# Patient Record
Sex: Female | Born: 1935 | Race: White | Hispanic: No | Smoking: Never smoker
Health system: Southern US, Community
[De-identification: ages and names within clinical notes are randomized; demographics above are authoritative.]

## PROBLEM LIST (undated history)

## (undated) DIAGNOSIS — I2510 Atherosclerotic heart disease of native coronary artery without angina pectoris: Secondary | ICD-10-CM

## (undated) DIAGNOSIS — K5090 Crohn's disease, unspecified, without complications: Secondary | ICD-10-CM

## (undated) DIAGNOSIS — J349 Unspecified disorder of nose and nasal sinuses: Secondary | ICD-10-CM

## (undated) DIAGNOSIS — I739 Peripheral vascular disease, unspecified: Secondary | ICD-10-CM

## (undated) DIAGNOSIS — I493 Ventricular premature depolarization: Secondary | ICD-10-CM

## (undated) DIAGNOSIS — K219 Gastro-esophageal reflux disease without esophagitis: Secondary | ICD-10-CM

## (undated) DIAGNOSIS — G629 Polyneuropathy, unspecified: Secondary | ICD-10-CM

## (undated) DIAGNOSIS — E164 Increased secretion of gastrin: Secondary | ICD-10-CM

## (undated) DIAGNOSIS — E785 Hyperlipidemia, unspecified: Secondary | ICD-10-CM

## (undated) DIAGNOSIS — J45909 Unspecified asthma, uncomplicated: Secondary | ICD-10-CM

## (undated) DIAGNOSIS — A498 Other bacterial infections of unspecified site: Secondary | ICD-10-CM

## (undated) DIAGNOSIS — E039 Hypothyroidism, unspecified: Secondary | ICD-10-CM

## (undated) DIAGNOSIS — K449 Diaphragmatic hernia without obstruction or gangrene: Secondary | ICD-10-CM

## (undated) DIAGNOSIS — R011 Cardiac murmur, unspecified: Secondary | ICD-10-CM

## (undated) DIAGNOSIS — N390 Urinary tract infection, site not specified: Secondary | ICD-10-CM

## (undated) DIAGNOSIS — E538 Deficiency of other specified B group vitamins: Secondary | ICD-10-CM

## (undated) DIAGNOSIS — K5730 Diverticulosis of large intestine without perforation or abscess without bleeding: Secondary | ICD-10-CM

## (undated) DIAGNOSIS — M4800 Spinal stenosis, site unspecified: Secondary | ICD-10-CM

## (undated) DIAGNOSIS — M129 Arthropathy, unspecified: Secondary | ICD-10-CM

## (undated) DIAGNOSIS — I10 Essential (primary) hypertension: Secondary | ICD-10-CM

## (undated) HISTORY — PX: POLYPECTOMY: SHX149

## (undated) HISTORY — DX: Ventricular premature depolarization: I49.3

## (undated) HISTORY — DX: Urinary tract infection, site not specified: N39.0

## (undated) HISTORY — PX: LEG SURGERY: SHX1003

## (undated) HISTORY — DX: Unspecified disorder of nose and nasal sinuses: J34.9

## (undated) HISTORY — DX: Cardiac murmur, unspecified: R01.1

## (undated) HISTORY — PX: COLONOSCOPY: SHX174

## (undated) HISTORY — DX: Diaphragmatic hernia without obstruction or gangrene: K44.9

## (undated) HISTORY — DX: Crohn's disease, unspecified, without complications: K50.90

## (undated) HISTORY — DX: Gastro-esophageal reflux disease without esophagitis: K21.9

## (undated) HISTORY — DX: Other bacterial infections of unspecified site: A49.8

## (undated) HISTORY — DX: Diverticulosis of large intestine without perforation or abscess without bleeding: K57.30

## (undated) HISTORY — PX: DILATION AND CURETTAGE OF UTERUS: SHX78

## (undated) HISTORY — DX: Hypothyroidism, unspecified: E03.9

## (undated) HISTORY — PX: CERVICAL POLYPECTOMY: SHX88

## (undated) HISTORY — DX: Arthropathy, unspecified: M12.9

## (undated) HISTORY — DX: Peripheral vascular disease, unspecified: I73.9

## (undated) HISTORY — DX: Hyperlipidemia, unspecified: E78.5

## (undated) HISTORY — DX: Essential (primary) hypertension: I10

## (undated) HISTORY — DX: Atherosclerotic heart disease of native coronary artery without angina pectoris: I25.10

## (undated) HISTORY — PX: CATARACT EXTRACTION: SUR2

## (undated) HISTORY — DX: Unspecified asthma, uncomplicated: J45.909

## (undated) HISTORY — DX: Spinal stenosis, site unspecified: M48.00

## (undated) HISTORY — DX: Deficiency of other specified B group vitamins: E53.8

## (undated) HISTORY — DX: Increased secretion of gastrin: E16.4

---
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
## 1974-03-17 HISTORY — PX: STAPEDECTOMY: SHX2435

## 1990-03-17 HISTORY — PX: FEMORAL ARTERY - POPLITEAL ARTERY BYPASS GRAFT: SUR180

## 1992-03-17 HISTORY — PX: KNEE ARTHROSCOPY: SUR90

## 1997-02-14 HISTORY — PX: ANGIOPLASTY: SHX39

## 2000-04-21 ENCOUNTER — Other Ambulatory Visit: Admission: RE | Admit: 2000-04-21 | Discharge: 2000-04-21 | Payer: Self-pay | Admitting: Obstetrics and Gynecology

## 2000-11-20 ENCOUNTER — Encounter: Admission: RE | Admit: 2000-11-20 | Discharge: 2000-11-20 | Payer: Self-pay | Admitting: Orthopedic Surgery

## 2000-11-20 ENCOUNTER — Encounter: Payer: Self-pay | Admitting: Orthopedic Surgery

## 2000-11-23 ENCOUNTER — Ambulatory Visit (HOSPITAL_BASED_OUTPATIENT_CLINIC_OR_DEPARTMENT_OTHER): Admission: RE | Admit: 2000-11-23 | Discharge: 2000-11-23 | Payer: Self-pay | Admitting: Orthopedic Surgery

## 2001-11-17 ENCOUNTER — Encounter: Payer: Self-pay | Admitting: Gastroenterology

## 2004-07-17 ENCOUNTER — Ambulatory Visit: Payer: Self-pay | Admitting: Cardiology

## 2004-08-13 ENCOUNTER — Ambulatory Visit: Payer: Self-pay | Admitting: Gastroenterology

## 2005-08-25 ENCOUNTER — Encounter (HOSPITAL_BASED_OUTPATIENT_CLINIC_OR_DEPARTMENT_OTHER): Admission: RE | Admit: 2005-08-25 | Discharge: 2005-10-22 | Payer: Self-pay | Admitting: Surgery

## 2006-05-26 ENCOUNTER — Ambulatory Visit: Payer: Self-pay | Admitting: Gastroenterology

## 2006-05-26 LAB — CONVERTED CEMR LAB
Albumin: 3.5 g/dL (ref 3.5–5.2)
Basophils Absolute: 0.1 10*3/uL (ref 0.0–0.1)
Bilirubin, Direct: 0.1 mg/dL (ref 0.0–0.3)
Ferritin: 6.8 ng/mL — ABNORMAL LOW (ref 10.0–291.0)
Folate: >20 ng/mL
GFR calc Af Amer: 102 mL/min
GFR calc non Af Amer: 84 mL/min
Glucose, Bld: 122 mg/dL — ABNORMAL HIGH (ref 70–99)
Hemoglobin: 12.6 g/dL (ref 12.0–15.0)
Iron: 21 ug/dL — ABNORMAL LOW (ref 42–145)
Lymphocytes Relative: 27.4 % (ref 12.0–46.0)
MCHC: 32.6 g/dL (ref 30.0–36.0)
Monocytes Absolute: 0.8 10*3/uL — ABNORMAL HIGH (ref 0.2–0.7)
Monocytes Relative: 8.4 % (ref 3.0–11.0)
Neutro Abs: 5.6 10*3/uL (ref 1.4–7.7)
Platelets: 317 10*3/uL (ref 150–400)
Potassium: 3.7 meq/L (ref 3.5–5.1)
RDW: 16.8 % — ABNORMAL HIGH (ref 11.5–14.6)
Sed Rate: 9 mm/hr (ref 0–25)
Sodium: 140 meq/L (ref 135–145)
TSH: 3.63 microintl units/mL (ref 0.35–5.50)
Vitamin B-12: 339 pg/mL (ref 211–911)

## 2006-06-03 ENCOUNTER — Encounter (INDEPENDENT_AMBULATORY_CARE_PROVIDER_SITE_OTHER): Payer: Self-pay | Admitting: *Deleted

## 2006-06-03 ENCOUNTER — Ambulatory Visit: Payer: Self-pay | Admitting: Gastroenterology

## 2006-08-17 ENCOUNTER — Ambulatory Visit: Payer: Self-pay | Admitting: Gastroenterology

## 2006-08-17 LAB — CONVERTED CEMR LAB
Basophils Relative: 0.9 % (ref 0.0–1.0)
Iron: 29 ug/dL — ABNORMAL LOW (ref 42–145)
Lymphocytes Relative: 31.5 % (ref 12.0–46.0)
Monocytes Relative: 10.2 % (ref 3.0–11.0)
Neutro Abs: 4.3 10*3/uL (ref 1.4–7.7)
Platelets: 303 10*3/uL (ref 150–400)
RBC: 5.52 M/uL — ABNORMAL HIGH (ref 3.87–5.11)
RDW: 16.8 % — ABNORMAL HIGH (ref 11.5–14.6)
Saturation Ratios: 6.3 % — ABNORMAL LOW (ref 20.0–50.0)
Transferrin: 330 mg/dL (ref >=212.0)

## 2006-10-06 ENCOUNTER — Ambulatory Visit: Payer: Self-pay | Admitting: Gastroenterology

## 2006-10-06 ENCOUNTER — Ambulatory Visit: Payer: Self-pay | Admitting: Vascular Surgery

## 2006-10-06 LAB — CONVERTED CEMR LAB
Basophils Absolute: 0.1 10*3/uL (ref 0.0–0.1)
Eosinophils Absolute: 0 10*3/uL (ref 0.0–0.6)
Eosinophils Relative: 0.1 % (ref 0.0–5.0)
MCV: 67 fL — ABNORMAL LOW (ref 78.0–100.0)
Platelets: 358 10*3/uL (ref 150–400)
RBC: 5.45 M/uL — ABNORMAL HIGH (ref 3.87–5.11)
Saturation Ratios: 5.1 % — ABNORMAL LOW (ref 20.0–50.0)
Transferrin: 281 mg/dL (ref >=212.0)
WBC: 7.7 10*3/uL (ref 4.5–10.5)

## 2007-03-18 HISTORY — PX: CARPAL TUNNEL RELEASE: SHX101

## 2007-06-30 DIAGNOSIS — N390 Urinary tract infection, site not specified: Secondary | ICD-10-CM

## 2007-06-30 DIAGNOSIS — I739 Peripheral vascular disease, unspecified: Secondary | ICD-10-CM

## 2007-06-30 DIAGNOSIS — E039 Hypothyroidism, unspecified: Secondary | ICD-10-CM | POA: Insufficient documentation

## 2007-06-30 DIAGNOSIS — E785 Hyperlipidemia, unspecified: Secondary | ICD-10-CM

## 2007-06-30 DIAGNOSIS — K5090 Crohn's disease, unspecified, without complications: Secondary | ICD-10-CM | POA: Insufficient documentation

## 2007-06-30 DIAGNOSIS — K449 Diaphragmatic hernia without obstruction or gangrene: Secondary | ICD-10-CM | POA: Insufficient documentation

## 2007-06-30 DIAGNOSIS — J45909 Unspecified asthma, uncomplicated: Secondary | ICD-10-CM | POA: Insufficient documentation

## 2007-06-30 DIAGNOSIS — M4800 Spinal stenosis, site unspecified: Secondary | ICD-10-CM

## 2007-06-30 DIAGNOSIS — M129 Arthropathy, unspecified: Secondary | ICD-10-CM | POA: Insufficient documentation

## 2007-06-30 DIAGNOSIS — K5730 Diverticulosis of large intestine without perforation or abscess without bleeding: Secondary | ICD-10-CM | POA: Insufficient documentation

## 2007-06-30 DIAGNOSIS — K219 Gastro-esophageal reflux disease without esophagitis: Secondary | ICD-10-CM

## 2007-06-30 DIAGNOSIS — I2510 Atherosclerotic heart disease of native coronary artery without angina pectoris: Secondary | ICD-10-CM | POA: Insufficient documentation

## 2007-09-21 ENCOUNTER — Ambulatory Visit: Payer: Self-pay | Admitting: Vascular Surgery

## 2007-09-30 ENCOUNTER — Encounter: Payer: Self-pay | Admitting: Gastroenterology

## 2007-10-28 ENCOUNTER — Ambulatory Visit: Payer: Self-pay | Admitting: Gastroenterology

## 2007-11-11 ENCOUNTER — Ambulatory Visit: Payer: Self-pay | Admitting: Cardiology

## 2008-04-16 ENCOUNTER — Inpatient Hospital Stay (HOSPITAL_COMMUNITY): Admission: EM | Admit: 2008-04-16 | Discharge: 2008-04-19 | Payer: Self-pay | Admitting: Emergency Medicine

## 2008-04-17 ENCOUNTER — Ambulatory Visit: Payer: Self-pay | Admitting: Surgery

## 2008-05-01 ENCOUNTER — Ambulatory Visit: Payer: Self-pay | Admitting: Surgery

## 2008-05-15 ENCOUNTER — Ambulatory Visit: Payer: Self-pay | Admitting: Surgery

## 2008-07-17 ENCOUNTER — Ambulatory Visit: Payer: Self-pay | Admitting: Vascular Surgery

## 2008-07-18 ENCOUNTER — Ambulatory Visit: Payer: Self-pay | Admitting: Vascular Surgery

## 2008-07-19 ENCOUNTER — Encounter: Payer: Self-pay | Admitting: Vascular Surgery

## 2008-07-19 ENCOUNTER — Inpatient Hospital Stay (HOSPITAL_COMMUNITY): Admission: EM | Admit: 2008-07-19 | Discharge: 2008-07-21 | Payer: Self-pay | Admitting: Internal Medicine

## 2008-08-02 ENCOUNTER — Ambulatory Visit: Payer: Self-pay | Admitting: Vascular Surgery

## 2008-08-16 ENCOUNTER — Ambulatory Visit: Payer: Self-pay | Admitting: Vascular Surgery

## 2008-11-13 ENCOUNTER — Encounter: Payer: Self-pay | Admitting: Cardiology

## 2008-11-13 DIAGNOSIS — I10 Essential (primary) hypertension: Secondary | ICD-10-CM | POA: Insufficient documentation

## 2008-11-13 DIAGNOSIS — R011 Cardiac murmur, unspecified: Secondary | ICD-10-CM | POA: Insufficient documentation

## 2008-11-13 DIAGNOSIS — I4510 Unspecified right bundle-branch block: Secondary | ICD-10-CM | POA: Insufficient documentation

## 2008-11-13 DIAGNOSIS — I4949 Other premature depolarization: Secondary | ICD-10-CM | POA: Insufficient documentation

## 2008-11-14 ENCOUNTER — Ambulatory Visit: Payer: Self-pay | Admitting: Cardiology

## 2008-11-22 ENCOUNTER — Ambulatory Visit: Payer: Self-pay | Admitting: Vascular Surgery

## 2008-12-07 ENCOUNTER — Ambulatory Visit: Payer: Self-pay | Admitting: Gastroenterology

## 2009-04-24 ENCOUNTER — Telehealth: Payer: Self-pay | Admitting: Gastroenterology

## 2009-09-04 ENCOUNTER — Encounter (INDEPENDENT_AMBULATORY_CARE_PROVIDER_SITE_OTHER): Payer: Self-pay | Admitting: *Deleted

## 2010-04-16 NOTE — Progress Notes (Signed)
Summary: Insurance will not pay for Colazal  Medications Added ASACOL 400 MG TBEC (MESALAMINE) 2 tabs two times a day       Phone Note Call from Patient Call back at Home Phone [phone]   Caller: Patient Call For: Dr. [name] Reason for Call: Talk to Nurse Summary of Call: Pt.'s insurance will not pay for the Colazal. Wants to know if something else can be prescribed? Initial call taken by: Karna Christmas,  April 24, 2009 3:09 PM  Follow-up for Phone Call        asacol 400 mg..2..//two times a day is ok   Follow-up by: Mardella Layman MD [facility],  April 24, 2009 4:10 PM  Additional Follow-up for Phone Call Additional follow up Details #1::        Pt notified.  Rx sent to pharmacy. Additional Follow-up by: Ashok Cordia RN,  April 24, 2009 4:31 PM    New/Updated Medications: ASACOL 400 MG TBEC (MESALAMINE) 2 tabs two times a day Prescriptions: ASACOL 400 MG TBEC (MESALAMINE) 2 tabs two times a day  #120 x 3   Entered by:   Ashok Cordia RN   Authorized by:   Mardella Layman MD [facility]   Signed by:   Ashok Cordia RN on 04/24/2009   Method used:   Electronically to        [facility] [phone]* (retail)       [address]       [place], [place]  [address]       Ph: [phone]       Fax: [phone]   RxID:   [id number]

## 2010-04-16 NOTE — Letter (Signed)
Summary: Appointment - Reminder 2  [facility], Main Office  [address]   [place], [place] [address]   Phone: [phone]  Fax: [phone]     September 04, 2009 MRN: [id number]   [facility] [address] [place], [place]  [address]   Dear Ms. MCBEAN,  Our records indicate that it is time to schedule a follow-up appointment with Dr. Myrtis Ser in August. It is very important that we reach you to schedule this appointment. We look forward to participating in your health care needs. Please contact us at the number listed above at your earliest convenience to schedule your appointment.  If you are unable to make an appointment at this time, give [place] a call so we can update our records.     Sincerely,   Migdalia Dk [facility] Scheduling Team

## 2010-06-25 LAB — DIFFERENTIAL
Basophils Absolute: 0 10*3/uL (ref 0.0–0.1)
Basophils Absolute: 0 10*3/uL (ref 0.0–0.1)
Basophils Relative: 0 % (ref 0–1)
Eosinophils Absolute: 0 10*3/uL (ref 0.0–0.7)
Eosinophils Relative: 0 % (ref 0–5)
Lymphocytes Relative: 14 % (ref 12–46)
Monocytes Absolute: 0.6 10*3/uL (ref 0.1–1.0)
Monocytes Absolute: 0.9 10*3/uL (ref 0.1–1.0)
Neutro Abs: 8.4 10*3/uL — ABNORMAL HIGH (ref 1.7–7.7)
Neutrophils Relative %: 78 % — ABNORMAL HIGH (ref 43–77)

## 2010-06-25 LAB — BASIC METABOLIC PANEL
CO2: 25 mEq/L (ref 19–32)
Calcium: 7.9 mg/dL — ABNORMAL LOW (ref 8.4–10.5)
Creatinine, Ser: 0.63 mg/dL (ref 0.4–1.2)
GFR calc Af Amer: >60 mL/min (ref >60)
GFR calc non Af Amer: >60 mL/min (ref >60)
Glucose, Bld: 154 mg/dL — ABNORMAL HIGH (ref 70–99)
Sodium: 134 mEq/L — ABNORMAL LOW (ref 135–145)

## 2010-06-25 LAB — TYPE AND SCREEN

## 2010-06-25 LAB — POCT I-STAT 4, (NA,K, GLUC, HGB,HCT)
Glucose, Bld: 117 mg/dL — ABNORMAL HIGH (ref 70–99)
HCT: 25 % — ABNORMAL LOW (ref 36.0–46.0)
Potassium: 3.7 mEq/L (ref 3.5–5.1)

## 2010-06-25 LAB — CBC
Hemoglobin: 10.1 g/dL — ABNORMAL LOW (ref 12.0–15.0)
MCHC: 33.1 g/dL (ref 30.0–36.0)
MCHC: 33.9 g/dL (ref 30.0–36.0)
MCV: 75.9 fL — ABNORMAL LOW (ref 78.0–100.0)
Platelets: 274 10*3/uL (ref 150–400)
RDW: 17.1 % — ABNORMAL HIGH (ref 11.5–15.5)
WBC: 8.7 10*3/uL (ref 4.0–10.5)

## 2010-06-25 LAB — PROTIME-INR: Prothrombin Time: 13.2 seconds (ref 11.6–15.2)

## 2010-07-01 LAB — BASIC METABOLIC PANEL
CO2: 26 mEq/L (ref 19–32)
Chloride: 104 mEq/L (ref 96–112)
Creatinine, Ser: 0.53 mg/dL (ref 0.4–1.2)
GFR calc Af Amer: >60 mL/min (ref >60)
Sodium: 140 mEq/L (ref 135–145)

## 2010-07-01 LAB — CBC
HCT: 46.3 % — ABNORMAL HIGH (ref 36.0–46.0)
Hemoglobin: 14.8 g/dL (ref 12.0–15.0)
RBC: 6.2 MIL/uL — ABNORMAL HIGH (ref 3.87–5.11)
RDW: 16 % — ABNORMAL HIGH (ref 11.5–15.5)
WBC: 9.9 10*3/uL (ref 4.0–10.5)

## 2010-07-01 LAB — DIFFERENTIAL
Basophils Absolute: 0 10*3/uL (ref 0.0–0.1)
Eosinophils Relative: 0 % (ref 0–5)
Lymphocytes Relative: 29 % (ref 12–46)
Lymphs Abs: 2.9 10*3/uL (ref 0.7–4.0)
Monocytes Absolute: 0.6 10*3/uL (ref 0.1–1.0)
Monocytes Relative: 6 % (ref 3–12)
Neutro Abs: 6.4 10*3/uL (ref 1.7–7.7)

## 2010-07-01 LAB — POCT I-STAT 4, (NA,K, GLUC, HGB,HCT)
Glucose, Bld: 175 mg/dL — ABNORMAL HIGH (ref 70–99)
Hemoglobin: 11.6 g/dL — ABNORMAL LOW (ref 12.0–15.0)
Potassium: 4.5 mEq/L (ref 3.5–5.1)
Sodium: 138 mEq/L (ref 135–145)

## 2010-07-01 LAB — TYPE AND SCREEN
ABO/RH(D): A NEG
Antibody Screen: NEGATIVE

## 2010-07-01 LAB — ABO/RH: ABO/RH(D): A NEG

## 2010-07-01 LAB — APTT: aPTT: 28 seconds (ref 24–37)

## 2010-07-02 LAB — CBC
Hemoglobin: 11.8 g/dL — ABNORMAL LOW (ref 12.0–15.0)
MCHC: 32.4 g/dL (ref 30.0–36.0)
MCHC: 33.7 g/dL (ref 30.0–36.0)
MCHC: 33.8 g/dL (ref 30.0–36.0)
MCV: 79.6 fL (ref 78.0–100.0)
MCV: 80.7 fL (ref 78.0–100.0)
Platelets: 205 10*3/uL (ref 150–400)
Platelets: 212 10*3/uL (ref 150–400)
RBC: 4.07 MIL/uL (ref 3.87–5.11)
RDW: 17.7 % — ABNORMAL HIGH (ref 11.5–15.5)

## 2010-07-02 LAB — BASIC METABOLIC PANEL
BUN: 9 mg/dL (ref 6–23)
BUN: 9 mg/dL (ref 6–23)
CO2: 22 mEq/L (ref 19–32)
Calcium: 7.4 mg/dL — ABNORMAL LOW (ref 8.4–10.5)
Chloride: 98 mEq/L (ref 96–112)
Creatinine, Ser: 0.49 mg/dL (ref 0.4–1.2)
GFR calc non Af Amer: >60 mL/min (ref >60)
Glucose, Bld: 118 mg/dL — ABNORMAL HIGH (ref 70–99)
Glucose, Bld: 192 mg/dL — ABNORMAL HIGH (ref 70–99)
Potassium: 3.7 mEq/L (ref 3.5–5.1)

## 2010-07-02 LAB — POCT I-STAT 7, (LYTES, BLD GAS, ICA,H+H)
Calcium, Ion: 1.13 mmol/L (ref 1.12–1.32)
HCT: 27 % — ABNORMAL LOW (ref 36.0–46.0)
Hemoglobin: 9.2 g/dL — ABNORMAL LOW (ref 12.0–15.0)
Potassium: 4.3 mEq/L (ref 3.5–5.1)
Sodium: 139 mEq/L (ref 135–145)
pCO2 arterial: 33.4 mmHg — ABNORMAL LOW (ref 35.0–45.0)
pH, Arterial: 7.407 — ABNORMAL HIGH (ref 7.350–7.400)

## 2010-07-02 LAB — PROTIME-INR
INR: 1.3 (ref 0.00–1.49)
Prothrombin Time: 15.4 seconds — ABNORMAL HIGH (ref 11.6–15.2)

## 2010-07-30 NOTE — Procedures (Signed)
BYPASS GRAFT EVALUATION   INDICATION:  Left lower extremity bypass graft.   HISTORY:  Diabetes:  No.  Cardiac:  No.  Hypertension:  Yes.  Smoking:  No.  Previous Surgery:  Thromboembolectomy of the left fem-pop bypass graft,  popliteal and anterior tibial arteries on 04/17/08.   SINGLE LEVEL ARTERIAL EXAM                               RIGHT              LEFT  Brachial:                    139                135  Anterior tibial:             Noncompressible    147  Posterior tibial:            148                120  Peroneal:  Ankle/brachial index:        Noncompressible    1.06   PREVIOUS ABI:  Date: 05/01/08  RIGHT:  Noncompressible  LEFT:  1.1   LOWER EXTREMITY BYPASS GRAFT DUPLEX EXAM:   DUPLEX:  Biphasic Doppler waveforms noted throughout the left lower  extremity bypass graft, and the popliteal and anterior tibial arteries  with a mildly increased velocity of 183 cm/s noted in the left proximal  anterior tibial artery.   IMPRESSION:  1. Patent left femoropopliteal bypass graft and thrombectomy sites      with an increased velocity noted, as described above.  2. Stable bilateral ABI readings with the bilateral toe brachial      indices remaining within normal limits. See attached worksheet for      values.       ___________________________________________  V. Charlena Cross, MD   CH/MEDQ  D:  07/17/2008  T:  07/17/2008  Job:  [id number]

## 2010-07-30 NOTE — Op Note (Signed)
[name], [name]               ACCOUNT NO.:  [id number]   MEDICAL RECORD NO.:  [id number]          PATIENT TYPE:  INP   LOCATION:  2550                         FACILITY:  [facility]   PHYSICIAN:  Juleen China IV, MDDATE OF BIRTH:  1915-10-20   DATE OF PROCEDURE:  04/17/2008  DATE OF DISCHARGE:                               OPERATIVE REPORT   PREOPERATIVE DIAGNOSIS:  Ischemic left leg.   POSTOPERATIVE DIAGNOSIS:  Ischemic left leg.   PROCEDURES PERFORMED:  1. Thromboembolectomy of left femoral below-knee popliteal artery      bypass graft.  2. Thromboembolectomy of left popliteal artery.  3. Thromboembolectomy of left anterior tibial artery.  4. Intraoperative angiogram.  5. Patch angioplasty of left popliteal artery with bovine pericardial      patch.   TYPE OF ANESTHESIA:  General.   BLOOD LOSS:  500 mL.   COMPLICATIONS:  None.   DRAINS:  15 Blake.   INDICATIONS:  This is a 77 year old female who presented to the  emergency department after her daughter found her left leg to be cold in  temperature.  The patient has had longstanding neuropathy.  She has not  walked for years.  At baseline, she does not have sensation or movement  in her left leg.  She has an ultrasound from approximately 1 year ago  that showed her bypass graft, which was performed in 1992 was still  patent.  I elected to perform an arteriogram prior to operation to  define her anatomy.  This revealed occlusion of her bypass graft with a  diminutive posterior tibial artery being her only runoff.  She had  minimal contrast visualized across the ankle.  These findings, I felt  that our only attempt at limb salvage would be operative intervention.  I discussed this extensively with the family.  They understand that she  is at high risk because of her age.  She is also at high risk for limb  loss.   PROCEDURE:  The patient identified in the holding area and taken to room-  6.  She was placed supine on  the table.  General endotracheal anesthesia  was administered.  The patient was prepped and draped in standard  sterile fashion.  I began in the below-knee incision.  A 10 blade was  used to open the skin.  Cautery was used to dissect through the  subcutaneous tissue.  The fascia was opened with cautery.  The popliteal  space was entered.  The soleus muscle was taken down off the posterior  edge of the tibia.  I identified the bypass graft and immobilized this  for approximately 2 cm and encircled it with a vessel loop.  The  popliteal artery was then identified and skeletonized.  The anterior  tibial artery and tibioperoneal trunk were also skeletonized.  At this  point, the patient was given systemic heparinization.  A transverse  arteriotomy was made in the hood of the graft.  I first passed a #5  Fogarty proximally, significant fresh thrombus was evacuated.  After  multiple passes of the embolectomy catheter, a fair  amount of neointima  was removed.  Once this was done, excellent inflow was established.  The  graft was occluded with a vascular clamp and then I passed #3 Fogarty  distally.  This went down to the ankle.  Thrombus was evacuated and  backbleeding was established.  At this point, an angiogram was performed  through an 8-French sheath in the graftotomy.  This revealed her  dominant runoff vessel to be her anterior tibial artery, which did cross  the ankle.  The angiogram did not evaluate a popliteal artery, but based  on her ultrasound over a year ago it suggested that there was  significant native popliteal artery disease below the bypass graft.  I  felt that this was most likely the region the bypass graft was occluded  and therefore I elected to open the popliteal artery.  This was opened  down to the anterior tibial artery.  The artery was heavily diseased.  I  elected to perform patch angioplasty of the popliteal artery beginning  at the bypass graft down to the takeoff  of the anterior tibial artery.  I did remove some of the intima in the distal popliteal artery at the  level down to the anterior tibial artery.  One 7-0 tacking suture was  placed in the anterior tibial artery.  Bovine pericardial patch was  selected, it was cut to the appropriate size and a running anastomosis  was performed with a 6-0 Prolene.  Once the patch angioplasty was  performed, the distal clamps were released and there was good  backbleeding.  I then performed an additional thrombectomy of the bypass  graft and reestablished excellent inflow.  The graftotomy was closed  with a running CV-5 Gore suture.  The clamp on the graft was then  released.  I had a palpable postop dorsalis pedis pulse at the ankle.  With this finding I elected to give her 15 mg of protamine to reverse  her heparin.  The wound was then irrigated.  Hemostasis was achieved and  brought out a 15 Blake drain through a separate incision.  It was sewn  into place with a 3-0 nylon.  The fascia was not closed, but the deep  tissue was reapproximated with 3-0 Vicryl.  The skin was closed with 4-0  Vicryl and Dermabond was placed.  The patient was then successfully  extubated.           ______________________________  V. Charlena Cross, MD  Electronically Signed     VWB/MEDQ  D:  04/17/2008  T:  04/17/2008  Job:  [id number]

## 2010-07-30 NOTE — Procedures (Signed)
BYPASS GRAFT EVALUATION   INDICATION:  Follow-up evaluation of left leg bypass graft.  Patient  complains of no symptoms at this time.   HISTORY:  Diabetes:  No.  Cardiac:  No.  Hypertension:  Yes.  Smoking:  No.  Previous Surgery:  Left fem-pop bypass graft with Gore-Tex on 12/27/90.   SINGLE LEVEL ARTERIAL EXAM                               RIGHT              LEFT  Brachial:                    141                155  Anterior tibial:             186                136  Posterior tibial:            76                 137  Peroneal:  Ankle/brachial index:        >1.0               0.88   PREVIOUS ABI:  Date: 07/31/07  RIGHT:  >1.0  LEFT:  >1.0   LOWER EXTREMITY BYPASS GRAFT DUPLEX EXAM:   DUPLEX:  Doppler arterial waveforms are biphasic proximal to, within,  and distal to the left fem-pop bypass graft with an elevated peak  systolic velocity in the distal native popliteal artery of 244 cm/s.   IMPRESSION:  1. Patent left femoropopliteal bypass graft.  2. Greater than 50% distal native popliteal artery stenosis.  3. Left ankle brachial index is slightly lower than previously      recorded.  4. Monophasic Doppler signal in the right leg with a >1.0 ankle      brachial index, suggesting medial calcification.   Preliminary report was given to Dr. Hart Rochester.   ___________________________________________  Quita Skye. Hart Rochester, M.D.   MC/MEDQ  D:  09/21/2007  T:  09/21/2007  Job:  [id number]

## 2010-07-30 NOTE — Assessment & Plan Note (Signed)
OFFICE VISIT   [name], [name]  DOB:  01/22/1916                                       08/16/2008  EAVWU#:[id number]   The patient is a 77 year old female who previously has had a left fem-  pop bypass and multiple thrombectomies of this.  The bypass has been  abandoned at this point.  She returns today for removal of staples as  well as reevaluation of her left foot.   She states that occasionally she does have some pain in the left foot.  She takes a half of a Percocet tablet two to three times per day.  The  pain seems to be fairly mild in character.   The patient's daughter also asked today about a possible angioplasty or  stenting procedure for the left leg.  I advised them that I did not  think she would be a candidate for this as this was going to be a long  segment occlusion in her left leg that would not be revascularizable  with angioplasty techniques.  I also advised them that I would be happy  to refer them for a second opinion if they wished to pursue this  further.  They did not seem to be interested in this at that time.   PHYSICAL EXAM:  Today blood pressure is 145/67 in the left arm, pulse is  80 and regular.  Left lower extremity she has a well-healed medial leg  incision just below the knee.  Staples were removed from this today.  Left foot is pink, warm and well-perfused.  There are no ulcerations on  the feet.  The foot is nontender to palpation.  There is no edema.   Overall I believe the patient is doing fairly well.  Her ABI on May 19  was 0.4.  However, she seems to be relatively asymptomatic.  I believe  the best option for her is continued conservative management.  We will  see her again in three months' time and repeat her ABIs at that time.  She will return sooner if she develops unrelenting rest pain, evidence  of tissue breakdown or evidence of infection in the left foot.   Janetta Hora. Fields, MD  Electronically  Signed   CEF/MEDQ  D:  08/16/2008  T:  08/17/2008  Job:  [id number]   cc:   Dr Carleene Overlie

## 2010-07-30 NOTE — Assessment & Plan Note (Signed)
OFFICE VISIT   [name], [name]  DOB:  1915-11-21                                       05/15/2008  WUJWJ#:[id number]   REASON FOR VISIT:  Followup.   HISTORY:  This is a 77 year old female who presented with an ischemic  left leg February 1.  She underwent a left femoral popliteal bypass  graft in 1992 which was confirmed to be patent by ultrasound last year.  It has occluded which led to ischemic leg.  I had attempted to perform a  thrombectomy which was unsuccessful and therefore did a jump graft off  her old bypass graft through her distal popliteal artery.  She tolerated  this procedure well and was discharged from the hospital.  When I saw  her last she had some edema in her left leg as well as some erythema  around her incision.  I placed her on Levaquin and recommended  compression therapy.  She comes back in today for followup.  Other than  some interval laryngitis she has been doing well at home.  Her  cellulitis has resolved.  Her swelling has improved with her  compression.   DICTATION ENDS AT THIS POINT   V. Charlena Cross, MD  Electronically Signed   VWB/MEDQ  D:  05/15/2008  T:  05/17/2008  Job:  [id number]

## 2010-07-30 NOTE — Discharge Summary (Signed)
NAMECAMBRIA, OSTEN               ACCOUNT NO.:  [id number]   MEDICAL RECORD NO.:  [id number]          PATIENT TYPE:  INP   LOCATION:  2015                         FACILITY:  [facility]   PHYSICIAN:  Juleen China IV, MDDATE OF BIRTH:  Jan 31, 1916   DATE OF ADMISSION:  04/16/2008  DATE OF DISCHARGE:  04/19/2008                               DISCHARGE SUMMARY   DISCHARGE DIAGNOSES:  1. Ischemic left leg.  2. Coronary artery disease.  3. Status post myocardial infarction.  4. Peripheral vascular disease.  5. Neuropathy.   PROCEDURES PERFORMED:  1. Thrombectomy of femoral popliteal bypass graft with intraoperative      arteriogram and bypass graft to distal popliteal artery with 6-mm      PROPATEN graft material by Dr. Myra Gianotti on April 17, 2008.  2. Thromboembolectomy of left femoral to below-knee popliteal artery      bypass graft with thromboembolectomy of left popliteal artery and      left anterior tibial artery with intraoperative angiogram and patch      angioplasty of left popliteal artery with bovine pericardial patch      on April 26, 2008, by Dr. Myra Gianotti.   COMPLICATIONS:  None.   CONDITION AT DISCHARGE:  Stable, improving.   DISCHARGE MEDICATIONS:  She is instructed to resume all preoperative  medications consisting of:  1. Nitrostat 0.4 mg sublingual p.r.n.  2. Lasix 20 mg p.r.n.  3. Vicodin one-half tablet p.o. 2 tablets daily.  4. Norvasc 5 mg p.o. daily.  5. Metoprolol 50 mg p.o. b.i.d.  6. Ecotrin 325 mg p.o. daily.  7. Nexium 40 mg p.o. b.i.d.  8. Synthroid 50 mcg p.o. daily.  9. Lipitor 40 mg p.o. nightly.  10.Albuterol 2 puffs p.r.n.  11.Advair 100/50 one puff b.i.d.  12.Beconase 0.42% 2 sprays b.i.d.  13.Neurontin 300 mg p.o. b.i.d.  14.Colazal 750 mg p.o. b.i.d.  15.Hyoscyamine 0.125 mg p.r.n.  16.She is given a prescription for Percocet 5/325 one p.o. q.6 h.      p.r.n. pain, total number of 20 were given.   DISPOSITION:  She is given careful  instructions regarding the care of  her wounds and her activity level.  Of note, she has an Mining engineer  wheelchair at home.  She is given careful instructions regarding the  care of her wounds.  She is given a return appointment with Dr. Myra Gianotti  in 2 weeks for wound check, ABIs, and staple removal.   BRIEF IDENTIFYING STATEMENT:  For complete details, please refer the  typed history and physical.  Briefly, this very pleasant 77 year old  woman was evaluated in the emergency room by Dr. Myra Gianotti for left lower  leg ischemia.  She is a previous recipient of a left femoral-popliteal  bypass graft.  The graft was found to be down.  He recommended  exploration and thrombectomy of the graft.  She and her family were  informed of the risks and benefits of the procedure and after careful  consideration, elected to proceed with surgery.   HOSPITAL COURSE:  Preparation for surgery were made on an urgent basis.  She  was taken to the operating room and underwent the aforementioned  revascularization procedure.  The procedure was without complications,  and she was returned to the Post Anesthesia Care Unit, extubated.  Prior  to her return to the Anesthesia Care Unit, however, her foot was found  to be pulseless, this required a second procedure, please refer the  typed operative report.  The procedure was without complications and she  was returned to the Post Anesthesia Care Unit, extubated.  Following  stabilization, she was transferred to a bed on a Surgical Step-Down  Unit.  She was observed overnight.  She was able to be transferred to a  bed on a surgical convalescent floor.   Her diet and activity level were advanced as tolerated.  It was felt by  April 19, 2008, she was back to her baseline.  We did felt that she is  stable to be discharged home and she is being discharged in stable  condition.      Wilmon Arms, PA      V. Charlena Cross, MD  Electronically Signed     KEL/MEDQ  D:  04/19/2008  T:  04/19/2008  Job:  [id number]

## 2010-07-30 NOTE — Assessment & Plan Note (Signed)
OFFICE VISIT   VAUDA, SALVUCCI  DOB:  1915-08-25                                       08/02/2008  WUJWJ#:[id number]   The patient returns for follow-up today.  She recently had thrombectomy  of her left fem-pop bypass on May 6th.  Prior to discharge from the  hospital, it appeared that her graft was going to reocclude.  She had no  rest pain symptoms at that time.  She returns for follow-up today.  Her  fem-pop bypass is occluded clinically and by duplex exam today.  She  again has minimal symptoms from this.  She does not ambulate and has no  claudication symptoms.  She does have some symptoms that may be vaguely  consistent with rest pain, but these are not very debilitating to her.  She takes 1-2 oxycodone tablets per day for pain control.   PHYSICAL EXAMINATION:  Today, blood pressure is 136/66 in the left arm,  pulse is 69 and regular.  Left below-knee incision is healing well, half  of the staples were removed today.  The left foot is cool with no  palpable pulses.  However, it is recently well-perfused.  She has no  ulcerations on the foot.   I had a lengthy discussion with the patient and her daughter today.  As  long as her pain is reasonably controlled with p.o. pain medication, I  believe the best option for her is conservative management with pain  control as needed.  If the pain becomes worse over time or she develops  ascending infection or nonhealing wounds in the left foot, she would  most likely need an above-knee amputation of the left leg.  She will  follow up with me in 2 weeks' time for removal of the remainder of her  staples.  She was given a prescription today for Percocet, #50  dispensed.  Her daughter will keep tabs on how much pain medication she  is requiring when she come back for follow-up in 2 weeks.   Janetta Hora. Fields, MD  Electronically Signed   CEF/MEDQ  D:  08/02/2008  T:  08/03/2008  Job:  [id number]   cc:   Carleene Overlie,  Dr.

## 2010-07-30 NOTE — Op Note (Signed)
[name], [name]               ACCOUNT NO.:  [id number]   MEDICAL RECORD NO.:  [id number]          PATIENT TYPE:  INP   LOCATION:  2550                         FACILITY:  [facility]   PHYSICIAN:  Janetta Hora. Fields, MD  DATE OF BIRTH:  23-Sep-1915   DATE OF PROCEDURE:  07/20/2008  DATE OF DISCHARGE:                               OPERATIVE REPORT   PROCEDURES:  1. Thrombectomy of left femoral-popliteal bypass.  2. Intraoperative arteriogram x1.   PREOPERATIVE DIAGNOSIS:  Ischemia, left lower extremity.   POSTOPERATIVE DIAGNOSIS:  Ischemia, left lower extremity.   ANESTHESIA:  General.   ASSISTANT:  [name], RNFA   INDICATIONS:  The patient is a 78 year old female who previously  underwent a left femoral-popliteal bypass in 1992.  This was revised in  February 2010.  She presented to the emergency room early this morning  after approximately 12 hours of coolness, numbness, and tingling in her  left foot.   OPERATIVE FINDINGS:  1. Patent anterior tibial artery to the foot.  2. No obvious distal narrowing.   OPERATIVE DETAIL:  After obtaining informed consent, the patient was  taken to the operating room.  The patient was placed in supine position  on the operating table.  After induction of general anesthesia and  endotracheal intubation, the patient's entire left lower extremity was  prepped and draped in usual sterile fashion.  Longitudinal incision was  made on the medial aspect of the leg below the knee through a  preexisting scar.  Incision was carried down through the subcutaneous  tissues and down through the fascia and into the popliteal space.  There  was dense scar tissue in this area.  I was able to locate the previous  PTFE graft and this was dissected free circumferentially.  Dissection  was carried down to the level of the previous anastomosis to the  popliteal artery.  There were even more dense adhesions in this  location.  The popliteal vein was injured  on 2 occasions and this was  repaired with interrupted 5-0 Prolene sutures.  At this point, the  patient was given 5000 units of intravenous heparin.  Transverse  graftotomy was made just above the level of anastomosis.  A #4 Fogarty  catheter was used to thrombectomize the proximal end of the bypass  graft.  Multiple passes were made and large amount of thrombus and  pseudointima was obtained.  There was excellent inflow at this point.  Multiple passes were made with small amounts of pseudointima removed  until the thrombectomy catheter came clean from the proximal aspect.  Graft was then reoccluded with a fistula clamp.  A #4 Fogarty catheter  was then passed down to distal portion of the graft and into the distal  anastomosis.  Catheter passed easily all the way to the level of the  foot.  Multiple passes were made and all thrombus was removed.  There  was good backbleeding from the anterior tibial artery at this point.  A  clean pass was also obtained distally.  The graftotomy was then  reapproximated using a running 6-0 Prolene  suture.  Just prior to  completion of anastomosis, forebled, backbled, and thoroughly flushed.  Anastomosis was secured.  Clamps were released.  There was pulsatile  flow into the graft immediately.  The patient also regained 2+ dorsalis  pedis pulse at this point.  Next, intraoperative arteriogram was  performed.  This was done by placing the 21-gauge butterfly needle into  the proximal portion of the bypass graft and with inflow occlusion,  contrast was injected into the bypass graft.  The patient was  premedicated with 25 mg of Benadryl and 10 mg of Solu-Medrol for  premedication due to a vague history of contrast allergy.  She had no  adverse effect from the contrast.  Intraoperative arteriogram shows a  patent distal anastomosis with 1-vessel runoff via the anterior tibial  artery, which is patent all the way to the level of the foot.  There was  no  obvious narrowing.  Next, the 21-gauge butterfly needle was removed.  The hole was repaired with a single 7-0 Prolene suture.  Hemostasis was  then obtained.  The fascial layers were then closed with multiple layers  of running 2-0 Vicryl suture.  Subcutaneous tissues were closed with a  running 3-0 Vicryl suture.  Skin was closed with staples.  The patient  tolerate the procedure well and there were no complications.  Instrument, sponge, and needle count was correct at the end of the case.  The patient was extubated in the operating room, taken to recovery room  in stable condition.  The patient had a palpable dorsalis pedis pulse on  leaving the operating room.      Janetta Hora. Fields, MD  Electronically Signed     CEF/MEDQ  D:  07/19/2008  T:  07/19/2008  Job:  [id number]

## 2010-07-30 NOTE — Op Note (Signed)
NAMEEARLEAN, [name]               ACCOUNT NO.:  [id number]   MEDICAL RECORD NO.:  [id number]          PATIENT TYPE:  INP   LOCATION:  2304                         FACILITY:  [facility]   PHYSICIAN:  Juleen China IV, MDDATE OF BIRTH:  02/05/1916   DATE OF PROCEDURE:  04/17/2008  DATE OF DISCHARGE:                               OPERATIVE REPORT   PREOPERATIVE DIAGNOSES:  Thrombosis of previously thrombectomized  femoral-popliteal bypass graft and patch angioplasty of her popliteal  artery.   POSTOPERATIVE DIAGNOSES:  Thrombosis of previously thrombectomized  femoral-popliteal bypass graft and patch angioplasty of her popliteal  artery.   PROCEDURE PERFORMED:  1. Thrombectomy of femoral popliteal bypass graft.  2. Intraoperative arteriogram.  3. Bypass graft to her distal popliteal artery with 6-mm probe patent.   TYPE OF ANESTHESIA:  General.   ESTIMATED BLOOD LOSS:  200 mL.   DRAINS:  15 Blake.   COMPLICATIONS:  None.   INDICATIONS:  [name] is a 76 year old female who previously  underwent thrombectomy of her femoral-popliteal bypass graft, popliteal  artery, and anterior tibial artery with patch angioplasty of her left  popliteal artery.  Following extubation, her left leg appeared to be  cold.  She had a palpable pulse in her dorsalis pedis artery at the end  of the procedure; however, this was no longer palpable.  Her leg  appeared to be ischemic, and so I elected to reintubate her and tried to  salvage her leg.  I discussed this with the family.   After successful reintubation, the patient's left leg was prepped and  draped in standard sterile fashion.  A time-out was again called.  The  patient's previous incision was reopened, sutures were removed, and self-  retaining retractors were placed.  I examined the bypass graft and it  did not have a pulse in it.  I reopened the previous graftotomy.  A #3  Fogarty catheter was advanced down the anterior tibial  artery.  Some  thrombus was evacuated and good backbleeding was established.  I  suspected that an intimal flap was likely the cause of her graft going  down again.  At this point, she was re-heparinized.  I elected to  transect her popliteal artery.  It was then opened down onto the  anterior tibial artery.  There was significant plaque at the origin of  her anterior tibial artery, which was likely the region where the  previous attempt failed.  A limited endarterectomy was performed.  Two  tacking 7-0 suture of Prolene sutures were placed to tack down the  plaque in the anterior tibial artery.  The anterior tibial artery was  occluded.  I selected a 6-mm probe patent Gore-Tex graft.  This end was  spatulated and end-to-end anastomosis was created from the distal  popliteal artery down onto the proximal anterior tibial artery.  The  anastomosis was constructed with a 6-0 Prolene.  I did shoot an  intraoperative arteriogram which revealed patent anterior tibial artery  across the ankle.  Once the anastomosis to the distal popliteal and  anterior tibial artery were  completed, I took down the old anastomosis  and spatulated this, which was matched to the end of the 6-mm probe  patent graft.  The end-to-end anastomosis was created with a running CV-  5 Gore suture.  Prior to completion of this, I performed  thromboembolectomy of the fem-pop bypass graft with a 5-French Fogarty  catheter.  Thrombus was evacuated and excellent inflow was established.  The graft anastomosis was then completed.  At this time, the patient  again had a palpable dorsalis pedis pulse.  I did not reverse her  heparin.  I placed a 15 Blake drain in the popliteal space.  The deep  tissue was reapproximated with 3-0 Vicryl.  The skin was closed with  staples.  Again, the patient was successfully extubated.  Her leg was  warm, and she had a palpable dorsalis pedis pulse.  She was then taken  to the recovery room in  stable condition.           ______________________________  V. Charlena Cross, MD  Electronically Signed     VWB/MEDQ  D:  04/17/2008  T:  04/17/2008  Job:  [id number]

## 2010-07-30 NOTE — Procedures (Signed)
BYPASS GRAFT EVALUATION   INDICATION:  Evaluation of lower extremity bypass graft.   HISTORY:  Diabetes:  No.  Cardiac:  No.  Hypertension:  Yes.  Smoking:  No.  Previous Surgery:  Thrombectomy of the left fem-pop bypass graft,  popliteal anterior tibial arteries on 04/17/08.  Thrombectomy of left  fem-pop bypass graft on 07/20/08.   SINGLE LEVEL ARTERIAL EXAM                               RIGHT              LEFT  Brachial:                    146                140  Anterior tibial:             Noncompressible    Unable to obtain  Posterior tibial:            Noncompressible    58  Peroneal:  Ankle/brachial index:        Noncompressible    0.40   PREVIOUS ABI:  Date:  07/17/08  RIGHT:  Noncompressible  LEFT:  1.06   LOWER EXTREMITY BYPASS GRAFT DUPLEX EXAM:   DUPLEX:  Sonographic evidence of occluded left fem-pop bypass graft.   IMPRESSION:  1. Occluded left femoropopliteal bypass graft.  2. Unable to obtain right ankle brachial index due to calcified      arteries.  3. Left ankle brachial index suggests severe arterial disease.     ___________________________________________  Janetta Hora. Fields, MD   AC/MEDQ  D:  08/02/2008  T:  08/02/2008  Job:  [id number]

## 2010-07-30 NOTE — Assessment & Plan Note (Signed)
[facility]                         GASTROENTEROLOGY OFFICE NOTE   [name], [name]                      MRN:          [id number]  DATE:08/17/2006                            DOB:          1915/10/14    [name] was last seen in March when she was found to have segmental  colitis and responded to oral aminosalicylate and topical  aminosalicylate therapy.  Biopsies of her colon suggested Crohn's  colitis.  This is an area of sigmoid diverticulosis most consistent with  so-called segmental colitis.  She was able to discontinue her enemas  but has had a relapse of her bleeding approximately 2 weeks ago and has  been back on nightly Rowasa enemas along with Colazal 750 mg three  tablets twice a day, is much better symptomatically but continues with  occasional small amounts of bleeding.  Denies abdominal pain or systemic  complaints.  She is on multiple medications, listed and reviewed in her  chart from my new patient evaluation on May 26, 2006.  She denies  abuse of NSAIDs but does take Celebrex.   She is a healthy appearing white female in no distress, appearing her  stated age.  She weights 149 pounds, her blood pressure is 120/68, pulse  was 80 and regular, she weighed 146 pounds.  ABDOMINAL EXAM:  Entirely benign without hepatosplenomegaly, masses,  tenderness, nor distention.  Bowel sounds were normal.  Inspection of the rectum was unremarkable as was rectal exam with soft  stool present that was guaiac negative to my exam.   ASSESSMENT:  [name] has segmental colitis which may require  prophylactic topical aminosalicylate therapy.   RECOMMENDATION:  1. Check repeat CBC and iron studies.  2. Continue Colazal at current levels.  3. Continue Rowasa enemas 4 g at bedtime for 2 weeks and then move to      every other week as tolerated.  4. GI followup in 1 month's time.  5. Other medications per Dr. Noralyn Pick and Dr. Jerral Bonito.     Vania Rea. [name], MD, [name], FAGA  Electronically Signed    DRP/MedQ  DD: 08/17/2006  DT: 08/17/2006  Job #: [id number]   cc:   Luis Abed, MD, [facility]  Noralyn Pick, M.D.

## 2010-07-30 NOTE — Discharge Summary (Signed)
[name], [name]               ACCOUNT NO.:  [id number]   MEDICAL RECORD NO.:  [id number]          PATIENT TYPE:  INP   LOCATION:  2022                         FACILITY:  [facility]   PHYSICIAN:  Janetta Hora. Fields, MD  DATE OF BIRTH:  04/08/1915   DATE OF ADMISSION:  07/19/2008  DATE OF DISCHARGE:  07/21/2008                               DISCHARGE SUMMARY   FINAL DISCHARGE DIAGNOSES:  1. Occluded left femoral-popliteal bypass with limb-threatening      ischemia.  2. Coronary artery disease.  3. Peripheral vascular disease.  4. Neuropathy.  5. History of myocardial infarction.   PROCEDURE PERFORMED:  Thrombectomy of left femoral-popliteal bypass with  intraoperative arteriogram x1 by Dr. Darrick Penna on Jul 20, 2008.   COMPLICATIONS:  None.   CONDITION AT DISCHARGE:  Stable, improving.   DISCHARGE MEDICATIONS:  She was instructed to resume all previously  scheduled medications consisting of Norvasc 5 mg p.o. daily, metoprolol  50 mg p.o. b.i.d., Ecotrin 225 mg p.o. daily, Nexium 40 mg p.o. b.i.d.,  Levaquin 250 mg p.o. daily for 7 days, Synthroid 50 mcg p.o. daily,  Lipitor 40 mg p.o. nightly, Neurontin 300 mg p.o. b.i.d., albuterol  nebulizations treatments q.6 h. p.r.n. wheezing, Advair 100/50 one puff  p.o. b.i.d.  She was given a prescription for Percocet 5/325 one p.o.  q.4 h. p.r.n. pain, total #23 was given.   DISPOSITION:  She is being discharged home in stable condition with her  wound healing well.  She was given careful instructions regarding the  care of her wound and her activity level.  She was given a return  appointment with Dr. Darrick Penna in 2 weeks with ABIs.   Brief identifying statement with complete details, please refer the  typed history and physical.  Briefly, this 77 year old woman underwent  left fem-pop in 1992, she underwent revision in 2010 by Dr. Myra Gianotti.  On  Jul 17, 2008, her bypass was opened.  Three days before her admission,  she was noted to have a  cold right foot.  She did not have pain in the  foot.  The sensation was slightly decreased.  Dr. Darrick Penna evaluated her  and found her graft to be occluded.  He recommended thrombectomy.  She  was informed of the risks and benefits of the procedure and after  careful consideration, he elected to proceed with surgery.   HOSPITAL COURSE:  Preoperative workup proceeded.  She was taken to the  operating room and underwent the aforementioned thrombectomy of her left  femoral-popliteal bypass graft.  For complete details, please refer the  typed operative report.  The procedure was without complications.  She  was returned to the postanesthesia care unit extubated.  Following  stabilization, she was transferred to a bed on a surgical step-down  unit.  She was observed overnight and was able to be transferred to a  bed on a surgical convalescent floor.  The patient initially had a  palpable dp pulse in her left foot postoperatively but this faded over  12 hr.  She still had some doppler flow and it was decided  that although  the bypass had probably reoccluded and additional thrombectomy was not  in her best interest.  Following day, she was desires of discharge.  She  was found to be stable with her wounds healing well.  An ABIs on the left extremity revealed a PT of 0.27.  She had run-off  only in that vessel.  She was discharged home in stable condition.  If  she develops rest pain or gangrenous changes an amputation would be  required.  This was discussed with the patients daughter prior to  discharge.      Wilmon Arms, PA      Janetta Hora. Fields, MD  Electronically Signed    KEL/MEDQ  D:  07/21/2008  T:  07/21/2008  Job:  [id number]

## 2010-07-30 NOTE — Assessment & Plan Note (Signed)
[facility]                            CARDIOLOGY OFFICE NOTE   [name], [name]                      MRN:          [id number]  DATE:11/11/2007                            DOB:          01-21-1916    [name] is here for cardiology followup.  I saw her last in May 2006.  She is now 77 years old.  She looks stable.  She has a twinkle in her  eye.  She is not having any significant symptoms.  She does have a  history of coronary artery disease.  A murmur has been heard and she is  here for further evaluation.  She is not having chest pain, syncope, or  presyncope and there are no signs of congestive heart failure.   Historically, she has coronary artery disease with a stent in 1988.  She  had a relook in 1999 and there was no intervention.  There was a  residual 70% LAD lesion.  There was no ischemia by Cardiolite in 2003.   ALLERGIES:  PENICILLIN, AVELOX, IVP DYE, MYCINS, and CODEINE.   MEDICATIONS:  1. Norvasc 5.  2. Aspirin 325.  3. Nexium.  4. Synthroid 0.05.  5. Lipitor 40.  6. Advair.  7. Flecainide.  8. Metoprolol 25 b.i.d.  9. Multivitamin.  10.Metamucil.  11.Neurontin 300 t.i.d.  12.Colazal.   OTHER MEDICAL PROBLEMS:  See the list below.   REVIEW OF SYSTEMS:  She has some mild seasonal allergies and hiatal  hernia symptoms.  She also has some arthritis.  Otherwise, her review of  systems is negative.   PHYSICAL EXAMINATION:  VITAL SIGNS:  Blood pressure is 131/66 with a  pulse of 69.  Weight is 139 pounds.  GENERAL:  She is sitting in a wheelchair today.  The patient is here  with a family member.  She is oriented to person, time, and place.  Affect is normal.  HEENT:  No xanthelasma.  She has normal extraocular motion.  There are  no significant carotid bruits.  There is no jugular venous distention.  LUNGS:  Clear.  Respiratory effort is not labored.  CARDIAC:  S1 and S2.  There is a 2/6 systolic murmur heard at the  left  upper sternal border.  I do not hear murmur at the apex.  ABDOMEN:  Soft.  She has no significant peripheral edema.   EKG reveals old right bundle-branch block.  She has normal sinus rhythm.   PROBLEMS:  1. Coronary artery disease with a stent in 1988 and a relook in 1999      with no restenosis.  There is residual 70% LAD that has been      stable.  Her last Cardiolite was done in 2003.  She does not need a      Cardiolite at this time.  2. History of spinal stenosis, followed by Dr. Avie Echevaria and Dr.      Barnett Abu.  3. Old right-bundle branch block.  4. History of PVCs by history.  5. History of hypercholesterolemia, treated.  6. Hypertension, treated.  7. History of  bladder difficulties.  8. History of hypothyroidism, treated.  9. History of Zollinger-Ellison syndrome with recurrent gastric      ulcerations in the past.  10.History of fem-pop surgery by Dr. Jerilee Field in the past.  11.History of an appendectomy in the past.  12.History of allergy to PENICILLIN and AVELOX.  13.Hearing loss.  14.*Systolic murmur.  I believe that this is a benign systolic murmur.      There is no shattering of her carotid upstrokes.  She has no      symptoms of significant aortic valve disease.  Her second heart      sound is appropriately split.  I have considered the possibility of      a 2-D echo, but I honestly believe it is not necessary at this      time.  I am recommending no further workup.  I would be more than      happy to see her on a yearly basis and decide if over time if her      aortic valve is becoming more of a significant issue.     Luis Abed, MD, [facility]  Electronically Signed    JDK/MedQ  DD: 11/11/2007  DT: 11/12/2007  Job #: [id number]   cc:   Lois Huxley, MD

## 2010-07-30 NOTE — Assessment & Plan Note (Signed)
[facility]                         GASTROENTEROLOGY OFFICE NOTE   SKYLYNN, BURKLEY                      MRN:          [id number]  DATE:10/06/2006                            DOB:          1915/11/14    Meryn is doing well with her Crohn's segmental colitis in her sigmoid  colon. She apparently was hospitalized several weeks ago with nausea,  vomiting and diarrhea felt secondary to acute gastroenteritis. She had  been using Colazal 750 mg three tablets twice a day along with Rowasa 4  gram enemas at bedtime. She has minimal GI complaints at this time.   VITAL SIGNS: Were all stable.  ABDOMEN: Examination is unremarkable.  RECTAL: Examination is deferred.   ASSESSMENT:  Joetta has Crohn's sigmoid colitis and seems to be  responding fairly well to aminosalicylate therapy.   RECOMMENDATIONS:  1. Will change from Rowasa enemas to Canasa 1 gram suppositories once      a day since her caretaker is undergoing knee surgery and will be      unable to give her enemas.  2. Continue Colazal at current dose.  3. Check followup CBC and iron studies. Will continue iron replacement      therapy.  4. GI followup in several months time or p.r.n. as needed.     Vania Rea. [name], MD, [name], FAGA  Electronically Signed    DRP/MedQ  DD: 10/06/2006  DT: 10/06/2006  Job #: [id number]   cc:   Princess Perna, MD

## 2010-07-30 NOTE — Assessment & Plan Note (Signed)
OFFICE VISIT   [name], [name]  DOB:  Nov 04, 1915                                       05/01/2008  ZOXWR#:[id number]   REASON THIS VISIT:  Follow-up.   HISTORY:  This is a 77 year old female who presented to the emergency  department on February 1 with an ischemic left leg.  She had a left  femoral-popliteal bypass graft performed in 1992 which was confirmed to  be patent by ultrasound last year.  Arteriogram prior her operation  revealed an occluded bypass graft with diminutive posterior tibial  artery being her only runoff.  I attempted to do a thrombectomy, was  able get the graft open and her runoff open and I also patched her  popliteal artery.  However upon extubation her foot was found to be  ischemic again.  The patient was re-anesthetized and a redo bypass was  performed to her distal popliteal artery with a 6 mm Propaten graft.  She had a relatively uneventful postoperative course and comes today for  follow-up.  She has been doing well at home.  She does have some edema  in her left leg as well as some complaints of redness.   On examination, there is some mild erythema around her incision and  throughout her leg from her knee to her ankle the leg is rather  edematous.   ASSESSMENT/PLAN:  Status post left femoral-popliteal bypass graft.   PLAN:  The patient is doing very well at this time.  She does normally  wear compression stockings.  I will have her continue to restart wearing  these.  This should help with her edema.  I am going to give her  prescription 5 days worth of Levaquin, just in case there is underlying  infection.  She is instructed to call me if this does not improve.  Otherwise she will be placed on bypass graft surveillance protocol.   Jorge Ny, MD  Electronically Signed   VWB/MEDQ  D:  05/01/2008  T:  05/02/2008  Job:  [id number]

## 2010-08-02 NOTE — Assessment & Plan Note (Signed)
[facility]                         GASTROENTEROLOGY OFFICE NOTE   [name]                      MRN:          [id number]  DATE:05/26/2006                            DOB:          10-Sep-1915    [name] is an 77 year old white female with multiple cardiovascular  problems and multiple drug allergies.  I followed her for many years  because of segmenta colitis with positive serologies for Crohn's  disease.  Colon biopsies also suggested Crohn's disease and she has been  on Colazal 750 mg two tablets twice a day for the last several years  with good results.  She recently switched from Colazal to generic  medication, has had a flare of her colitis with bleeding, cramping, and  some diarrhea.  She denies nausea and vomiting, fever or chills.  In any  case, she was due for colonoscopy followup.  She has not been on recent  antibiotics and denies NSAID abuse but does take p.r.n. Celebrex.   She is followed by Dr. Myrtis Ser for her cardiovascular problems and has had  previous stents.  She also suffers from recurrent peptic ulcer disease  but I cannot see where she is on PPI therapy at this time.  Her last  colonoscopy exam was in January of 2005, at which time she had multiple  rather prominent polyps excised in addition to her diverticulosis and  segmental colitis.  She had endoscopy in September of 2003 which showed  a large 8 cm hiatal hernia and she is supposed to be on Nexium 40 mg  twice a day.  She has suspected underlying Zollinger-Ellison syndrome  with a history of hypergastrinemia.  In the past she had been on Plavix  because of her coronary artery disease but I do not think she is on such  at this time.  She also has a history of spinal stenosis,  hyperlipidemia, and recurrent bronchitis.   MEDICATIONS:  1. Norvasc 5 mg a day.  2. Aspirin 325 mg a day.  3. Nexium 40 mg twice a day.  4. Synthroid 0.05 mg a day.  5. Lipitor 40 mg a  day.  6. Neurontin 300 mg twice a day for peripheral neuropathy.  7. Advair inhalers 100-50 twice a day.  8. Beconase spray twice a day.  9. Colazal, as mentioned above.  10.Multivitamins.  11.Metamucil.  12.Imodium p.r.n.  13.She also uses p.r.n. Celebrex, hyoscyamine, nitroglycerin and Lasix      and albuterol inhalers.   IN THE PAST SHE HAS HAD REACTION TO:  1. PENICILLIN.  2. IVP DYE.  3. MYCINS.  4. CODEINE.   Further review of her chart shows that the patient had stent placed in  her right coronary artery but also has left coronary artery disease.  She has old right bundle branch block and a history of benign PVCs.  Her  hypertension has been under good control.   EXAMINATION:  She is a healthy appearing white female in no distress,  appearing her stated age.  We are unable to weigh her at this time.  Blood pressure 130/64 and pulse  was 64 and regular.  I could not  appreciate stigmata of chronic liver disease.  CHEST:  Clear and she appeared to be in a regular rhythm without  murmurs, gallops or rubs.  ABDOMEN:  Entirely benign without organomegaly, masses or tenderness.  RECTAL:  Deferred.  There was no peripheral edema or phlebitis.  MENTAL STATUS:  Clear.   ASSESSMENT:  1. Segmental colitis (Crohn's disease) of her sigmoid colon area with      recent flare, etiology unclear.  2. Multiple cardiovascular issues with no known history of ischemic      bowel disease.  3. Large hiatal hernia with chronic acid reflux and possible Zollinger-      Ellison syndrome.  4. Well controlled hypertension.  5. Chronic thyroid dysfunction, on Synthroid.  6. Spinal stenosis with history of peripheral neuropathy on Neurontin      therapy.  7. Degenerative arthritis with Celebrex use.   RECOMMENDATIONS:  1. I have given her prescription Colazal to take 750 mg two twice a      day instead of generic.  2. Outpatient colonoscopy at her convenience.  3. Multiple screening  laboratory parameters.  4. Continue reflux regimen and twice a day PPI therapy.  5. Other medications as per Drs. Myrtis Ser and Lynch.     [name]. [name], MD, [name], FAGA  Electronically Signed    DRP/MedQ  DD: 05/26/2006  DT: 05/28/2006  Job #: [id number]   cc:   [name], D.O.  Glenda Abed, MD, [facility]

## 2010-08-02 NOTE — Op Note (Signed)
[place]  Patient:    RAYNA, BRENNER Visit Number: [id number] MRN: [id number]          Service Type: DSU Location: [facility] Attending Physician:  Alinda Deem Dictated by:   Alinda Deem, M.D. Proc. Date: 11/23/00 Admit Date:  11/23/2000                             Operative Report  PREOPERATIVE DIAGNOSIS:  Right carpal tunnel syndrome.  POSTOPERATIVE DIAGNOSIS:  Right carpal tunnel syndrome.  PROCEDURE:  Right carpal tunnel release.  SURGEON:  Alinda Deem, M.D.  FIRST ASSISTANT:  Dorthula Matas, P.A.-C.  ANESTHESIA:  IV regional.  ESTIMATED BLOOD LOSS:  Minimal.  FLUID REPLACEMENT:  300 cc of crystalloid.  DRAINS PLACED:  None.  TOURNIQUET TIME:  Twenty-five minutes.  INDICATION FOR PROCEDURE:  [age]-year-old woman with EMG-proven severe bilateral carpal tunnel syndrome as well as other multiple medical problems primarily with her heart.  She has received medical clearance for carpal tunnel release in an effort to decrease her pain and increase the function of her hand; she already had some thenar wasting when I first met her.  DESCRIPTION OF PROCEDURE:  The patient identified by arm band, taken to the operating room at [facility], appropriate anesthetic monitors were attached and IV regional anesthesia induced with the patient in a supine position through a forearm tourniquet.  The right hand was then prepped and draped in the usual sterile fashion from the fingertips to the tourniquet and we began the procedure by making an anterior midline incision, starting at the wrist flexion crease and going distally for 4 cm, just to the ulnar side of the thenar crease, through the skin and just into the subcutaneous tissue. Using retractors, we kept the tissue on tension and cut down to the palmar fascia distally.  I made a small incision in the palmar fascia, allowing placement of a Freer  elevator into the volar aspect of the carpal tunnel. With a Glorious Peach in place, we then cut down on it, performing a carpal tunnel release, and using the Glorious Peach to protect the median nerve.  We then carefully inspected the floor of the carpal tunnel using L-shaped retractors as well as a small finger, gloved, and found no masses in the carpal tunnel.  At this point, the wound was washed out with normal saline solution and the skin only closed with running 5-0 nylon suture.  A dressing of Xeroform, 4 x 4 dressing sponges x 2, Webril, an Ace wrap and a ______ splint applied.  The patient was then taken to the recovery room without difficulty after letting the tourniquet down. Dictated by:   Alinda Deem, M.D. Attending Physician:  Alinda Deem DD:  11/23/00 TD:  11/23/00 Job: [id number]

## 2010-08-02 NOTE — Assessment & Plan Note (Signed)
[facility]   NAME:  [name], [name]               ACCOUNT NO.:  [id number]   MEDICAL RECORD NO.:  [id number]      DATE OF BIRTH:  1915-09-24   PHYSICIAN:  Jonelle Sports. Sevier, M.D.  VISIT DATE:  09/23/2005                                     OFFICE VISIT   VITAL SIGNS:   PURPOSE OF TODAY'S VISIT:  This 77 year old white female is seen in follow  up for inflammatory stasis dermatitis of the left lower extremity.  She had  been originally in a Profore wrap which was poorly tolerated then changed to  Profore Light which was well-tolerated and treated with TCA cream.  She  removed the most recent wrap the night before last in order to go yesterday  to have fitting for 20/30 compression hose and comes in today wearing such  hose on her right lower extremity but not on the left.   She reports that she still has some degree of rash and some itching but that  everything is dramatically improved.  She says she does have some shooting  pains in the area.  She has had no fever, chills or systemic symptoms.  She  has noted no drainage or odor.  She has had no change in medication.   WOUND EXAM:  Blood pressure 120/68, pulse 64, regular, respirations 16,  temperature 98.4.   There is no open wound on the left lower extremity. There is considerable  mildly inflamed stasis dermatitis, however, persisting in the anteromedial  and posteromedial aspects of the left lower extremity from approximately two-  thirds of the way down the calf extending down to the malleolar area.  There  is some petechial changes within this area.   WOUND SINCE LAST VISIT:   CHANGE IN INTERVAL MEDICAL HISTORY:   DIAGNOSIS:  Improved but not resolved stasis dermatitis, left lower  extremity.   TREATMENT:   ANESTHETIC USED:   TISSUE DEBRIDED:   LEVEL:   CHANGE IN MEDS:   COMPRESSION BANDAGE:   OTHER:   MANAGEMENT PLAN & GOAL:   DISPOSITION:  It is explained to the patient that in all  likelihood if we  attempt to convert to the 20/30 hose at this point that her problems will  likely worsen again.  Accordingly, this is a is recommended that we return  her to a wrap, namely Profore Light with which she is in agreement.   Before placing such wrap, the area is treated with an application of  ketoconazole then overlaid with triamcinolone cream 0.1%.  She is placed in  a Profore Light wrap and will be seen in followup in ten days with the  anticipation that she can likely go into her compression hose at that time.   Accordingly, she is requested to bring her stocking with her at that time  for the transition.           ______________________________  Jonelle Sports Cheryll Cockayne, M.D.     RES/MEDQ  D:  09/23/2005  T:  09/23/2005  Job:  [id number]

## 2010-08-02 NOTE — Assessment & Plan Note (Signed)
[facility]   NAME:  [name], [name]               ACCOUNT NO.:  [id number]   MEDICAL RECORD NO.:  [id number]      DATE OF BIRTH:  18-Jun-1915   PHYSICIAN:  Maxwell Caul, M.D.      VISIT DATE:                                     OFFICE VISIT   CHIEF COMPLAINT:  Followup severe venous stasis and stasis dermatitis.  Ms.  [name] was seen today in followup from her visit from one week ago.  At that  point and time she had a Profore wrap placed, which ultimately was changed  to a Profore Lite_.  She was also treated with TCA cream and ketoconazole.  She comes in today in followup.   WOUND EXAM:  There is no open wounds on either lower extremity.  There is  absolutely no evidence of inflammation associated with her stasis  dermatitis.  Her leg looks fine.  There is no evidence of arterial  insufficiency and her peripheral pulses are palpable.   IMPRESSION:  Severe venous stasis with stasis dermatitis.  This is  remarkably better over the last week with current treatment.  She had went  out and purchased her graded pressure stockings as requested.  These were  20/30 hose closed toe.  We watched her daughter who lives with her apply  them and she seems to have good technique.   At this point I do not think anything further is required other than to wear  the hose and I have explained this to them both.  It is likely they will  need to buy a second pair.  She can be followed in the wound care clinic  p.r.n.      Maxwell Caul, M.D.  Electronically Signed     MGR/MEDQ  D:  10/03/2005  T:  10/03/2005  Job:  [id number]

## 2010-08-02 NOTE — Assessment & Plan Note (Signed)
[facility]   NAME:  [name], [name]               ACCOUNT NO.:  [id number]   MEDICAL RECORD NO.:  [id number]           DATE OF BIRTH:   PHYSICIAN:  Theresia Majors. Tanda Rockers, M.D. VISIT DATE:  10/13/2005                                     OFFICE VISIT   VITAL SIGNS:  Her blood pressure is 137/62, respirations 18, pulse rate 68,  and she is afebrile.   PURPOSE OF TODAY'S VISIT:  [name] returns complaining of swelling in her  left lower extremity.  She denies pain and denies ulceration.  She was  recently discharged with a heel stasis ulcer.  She has procured the support  hose and has worn them.   WOUND EXAM:  EXTREMITIES:  The lower extremity exam is remarkable for  unilateral edema.  There is 3+ edema in the left lower extremity with  hyperemia but no particular tenderness.  There is no tenderness in the  popliteal fossa.  There is no tenderness in the soleus area.  The areas of  hyperpigmentation are persistent.  There is a bounding 3+ dorsalis pedis  pulse.  These stockings have been inspected, and they do not appear to have  adequate compression on digital exam.   WOUND SINCE LAST VISIT:   CHANGE IN INTERVAL MEDICAL HISTORY:   DIAGNOSIS:  Inadequate compression with persistent stasis but no ulceration.   TREATMENT:   ANESTHETIC USED:   TISSUE DEBRIDED:   LEVEL:   CHANGE IN MEDS:   COMPRESSION BANDAGE:   OTHER:   MANAGEMENT PLAN & GOAL:  We have replaced a multilayer compression wrap to  provide adequate compression.  We will reevaluate the patient in one week.  If, under adequate compression with the multilayer wrap there is considered  improvement, we will re-order her compression hose at 30-40 mm below-the-  knee open-toe  hose.  We have explained the utility of adequate compression and the  management of stasis to the patient and her sister in terms that they seem  to understand.  We will reevaluate her in one week.  Both parties seem to  be  satisfied with this explanation and plan.           ______________________________  Theresia Majors. Tanda Rockers, M.D.     Cephus Slater  D:  10/13/2005  T:  10/13/2005  Job:  [id number]

## 2010-08-02 NOTE — Assessment & Plan Note (Signed)
[facility]   NAME:  [name], [name]               ACCOUNT NO.:  [id number]   MEDICAL RECORD NO.:  [id number]      DATE OF BIRTH:  08-16-1915   PHYSICIAN:  Theresia Majors. Tanda Rockers, M.D. VISIT DATE:  09/08/2005                                     OFFICE VISIT   DATE OF VISIT:  September 08, 2005.   SUBJECTIVE:  [name] returns for followup of the left lower extremity  stasis edema with cellulitis.  During the interim, she has worn a Profore  Lite.  We had given her a prescription to get a 20 to 30-mm hose, but  apparently there was some confusion and she did not get this filled.  We  have reinstructed her to fill the prescription with the measurements taken  from her right lower extremity.   OBJECTIVE:  VITAL SIGNS:  Her vital signs are stable.  She is afebrile.  EXTREMITIES:  Inspection of the left lower extremity shows a persistence of  3+ edema.  The area of stasis remains intense and is associated with areas  of intense desquamation.  These areas were partially debrided without  incident.  The pedal pulse remains intact.   ASSESSMENT:  Persistent stasis and cellulitis.   PLAN:  We have placed the patient in a Profore Lite with TCA ointment.  We  have instructed her to proceed with procuring the 20 to 30 mm below-the-knee  support hose.  We will reevaluate her in 10 days.           ______________________________  Theresia Majors Tanda Rockers, M.D.     [name]  D:  09/08/2005  T:  09/08/2005  Job:  [id number]

## 2010-08-02 NOTE — Assessment & Plan Note (Signed)
[facility]   NAME:  COURTLAND, COPPA               ACCOUNT NO.:  [id number]   MEDICAL RECORD NO.:  [id number]      DATE OF BIRTH:  [birth year]   PHYSICIAN:  Theresia Majors. Tanda Rockers, M.D. VISIT DATE:  08/28/2005                                     OFFICE VISIT   CLINIC VISIT - August 28, 2005.   REASON FOR VISIT:  Ms. Ehly is seen for followup of a stasis ulcer on her  left lower extremity.  In the interim, the patient was placed in a  multilevel compression wrap but she removed it due to pain.  She denies  fever or malodorous drainage.   OBJECTIVE:  VITAL SIGNS:  Her vital signs are stable. She is afebrile.  EXTREMITIES:  The area in the posterior aspect of the soleus area in the  left lower extremity has resolved.  There is some weepiness but there is no  frank ulceration.  There is a persistence of 3+ edema with hyperemia.  There  is some pain in the soleus area but this does not extend into the popliteal  fossa.  The dorsalis pedis pulse remains readily palpable.   ASSESSMENT:  Improved but persistent stasis and edema.   PLAN:  We placed the patient in a Profore light.  We have given her a  prescription for bilateral below the knee 20 to 30 mm compression hose.  We  will see her in 10 days.  The patient has been instructed to bring the  stockings with her at which point we will remove her compression wrap and  place her in the compression hose.           ______________________________  Theresia Majors. Tanda Rockers, M.D.     Cephus Slater  D:  08/28/2005  T:  08/28/2005  Job:  [id number]

## 2010-08-02 NOTE — Assessment & Plan Note (Signed)
[facility]   NAME:  [name], [name]               ACCOUNT NO.:  [id number]   MEDICAL RECORD NO.:  [id number]           DATE OF BIRTH:   PHYSICIAN:  Theresia Majors. Tanda Rockers, M.D. VISIT DATE:  10/20/2005                                     OFFICE VISIT   SUBJECTIVE:  [name] returns for follow-up of severe unilateral stasis.  In the interim she has worn a compression wrap.  She denies pain, fever, or  drainage.   OBJECTIVE:  Following the removal of the wrap there are no open wounds.   IMPRESSION:  Resolved stasis changes.   PLAN:  We have instructed the patient and her daughter in the use of  external compression hose.  Accordingly, we have prescribed 20-30 mm  bilateral below-the-knee compression hose.  We have given her a  prescription.  For the stasis changes with desquamation we have recommended  and prescribed triamcinolone cream 0.025% dispensed 454 g to be applied  daily.  We are discharging the patient to the care of her primary care  physician.           ______________________________  Theresia Majors. Tanda Rockers, M.D.     Cephus Slater  D:  10/20/2005  T:  10/20/2005  Job:  [id number]   cc:   Noralyn Pick, M.D.

## 2010-08-02 NOTE — Consult Note (Signed)
[name], [name]               ACCOUNT NO.:  [id number]   MEDICAL RECORD NO.:  [id number]          PATIENT TYPE:  REC   LOCATION:  FOOT                         FACILITY:  [facility]   PHYSICIAN:  Theresia Majors. Tanda Rockers, M.D.DATE OF BIRTH:  08-09-1915   DATE OF CONSULTATION:  08/25/2005  DATE OF DISCHARGE:                                   CONSULTATION   REASON FOR CONSULTATION:  [name] is an 77 year old female referred by  Dr. Reinaldo Raddle of [facility].  She is referred for the evaluation of pain,  swelling, and ulceration of the left lower extremity.   IMPRESSION:  Stasis cellulitis with ulceration.   RECOMMENDATIONS:  Proceed with a Una boot/Profore protocol with  triamcinolone cream topical.   SUBJECTIVE:  This 77 year old lady has been in reasonable compensated health  over the last several months.  She has complained of progressive unilateral  left lower extremity swelling, itching, and blister formation. The blisters  have erupted on occasion, resulting in a superficial ulcer which has healed.  She has worn TED stockings in the past but currently does not use  compression therapy.  She denies fever.   PAST MEDICAL HISTORY:  Significant for:  1.  Hypothyroidism.  2.  Hypercholesterolemia.  3.  COPD.  4.  Gastroesophageal reflux.  5.  Coronary artery disease.  6.  Peripheral vascular disease.   MEDICATIONS:  1.  Mupirocin 2% b.i.d.  2.  Aspirin 325 mg daily.  3.  Nexium.  4.  Synthroid.  5.  Lipitor.  6.  Advance.  7.  Beconase.  8.  Albuterol.  9.  Norvasc.  10. Nitroglycerin.  11. Neurontin.  12. Multivitamin supplement.   PAST SURGICAL HISTORY:  Has included:  1.  Multiple coronary stents and a left femoral-popliteal bypass 2 years      ago.  2.  Appendectomy.  3.  Stapedectomy.  4.  Knee arthroscopy.  5.  Carpal tunnel release in 1992.  6.  In 1998, an angioplasty.  7.  She had a recent venous Doppler which was negative for deep vein      thrombosis in  April 2007. This was performed in consideration and      evaluation of the unilateral swelling.   FAMILY HISTORY:  Positive for hypertension, coronary disease, and cancer.   SOCIAL HISTORY:  She is widowed.  She is assisted by her daughter.   REVIEW OF SYSTEMS:  Specifically negative for angina pectoris, recent weight  loss or gain, hematochezia, or hemoptysis.  The remainder of the Review of  Systems is negative.   PHYSICAL EXAMINATION:  GENERAL: She is an alert, oriented, pleasant, elderly  female looking younger than her stated age.  She is alert and in good  contact with reality.  VITAL SIGNS: Blood pressure 122/70, pulse rate 100, respirations 18, and  temperature 98.3.  HEENT: Exam is clear.  NECK:  Supple.  Trachea is midline. Thyroids not palpable.  LUNGS: Clear.  Breath sounds are normal.  CARDIAC: Heart sounds are distant.  ABDOMEN:  Soft without aneurysmal diltation.  EXTREMITIES:  Femoral pulses are  3+ bilaterally.  On the right, there is an  indeterminate dorsalis pedis pulse, but there is good capillary refill.  On  the left, there is a bounding +3 pulse in the dorsalis pedis.  There is well-  healed popliteal incision on the medial aspect of the gastrocnemius.  There  are chronic changes of stasis with various stages of healed blisters and  fragile eschar.  There is some mild weeping.  The hyperemia extends  throughout the gaiter area but does not go inferior to the malleoli nor does  it ascend above the mid portion of the soleus.  NEUROLOGIC: The patient retains protective sensation.   DISCUSSION:  The patient's findings and history are consistent with a post  surgical stasis in the left leg which has been slow to respond to  compression therapy over the years  She has had breakdown and is now at a  stage in which she seems to be progressing with dependent edema.  We have  counseled the daughter and the patient in terms that they seem to  understand, explaining  that the primary mode of treatment will be to provide  external compression and approximately a 30 mm gradient.  The history of the  previous vascular surgery means that we would have to be cautious, but he  current exam suggests that she has adequate inflow, and there would be no  risk to obstructing the outflow with the compression wrap.  We have  explained to them that we would place her in a Profore with TCA ointment.  We will see her back in 7-10 days for followup.  They have been given an  opportunity to ask questions.  They seem to understand and express intent to  comply with the recommendations.  The patient currently has 2-3+ edema, and  this will be managed with the Profore protocol.           ______________________________  Theresia Majors. Tanda Rockers, M.D.     [name]  D:  08/25/2005  T:  08/25/2005  Job:  [id number]   cc:   Dr. Dorann Ou

## 2010-08-06 ENCOUNTER — Encounter: Payer: Self-pay | Admitting: Gastroenterology

## 2010-08-06 ENCOUNTER — Ambulatory Visit (INDEPENDENT_AMBULATORY_CARE_PROVIDER_SITE_OTHER): Payer: MEDICARE | Admitting: Gastroenterology

## 2010-08-06 DIAGNOSIS — K5090 Crohn's disease, unspecified, without complications: Secondary | ICD-10-CM

## 2010-08-06 DIAGNOSIS — K625 Hemorrhage of anus and rectum: Secondary | ICD-10-CM

## 2010-08-06 NOTE — Patient Instructions (Signed)
Your procedure has been scheduled for 08/14/2010, please follow the seperate instructions.  Increase your Asacol to three tablets by mouth twice a day. Use your Rowasa as needed.

## 2010-08-06 NOTE — Progress Notes (Signed)
This is a 77 year old Caucasian female comes with her daughter today for evaluation of intermittent rectal bleeding and she is on the care of Dr. Lois Huxley in [place]. The patient has severe diverticulosis with documented segmental colitis minas been doing well all oral  amino salicylates in the form of Asacol 800 mg twice a day until recently when she's had some episodes of bright red blood per rectum with vague lower abdominal discomfort. We have added for gram Rowasa enemas at bedtime with mild improvement. She denies upper gastrointestinal or hepatobiliary complaints. Her general health otherwise is doing fairly well except for severe pruritus with peripheral neuropathy, chronic thyroid dysfunction, and hypertensive cardiovascular disease. She is on multiple medications listed and reviewed her record. She does take daily psyllium and when necessary hydrocodone.  Current Medications, Allergies, Past Medical History, Past Surgical History, Family History and Social History were reviewed in [employer] record.  Pertinent Review of Systems Negative... she does have shortness of breath with exertion and limited exercise tolerance and limited mobility. She seems stable from a cardiovascular pulmonary standpoint otherwise. Patient denies any specific food intolerances, gas., bloating, redness and reflux symptoms. She is on Nexium 40 mg a day. Last colonoscopy exam was several years ago.   Physical Exam: Surprisingly fit elderly white female appears stated age with good mentation . Not appreciated stigmata of chronic liver disease. Her chest is generally clear she appears to be in a regular rhythm without murmurs gallops or rubs. Abdomen shows no organomegaly, masses, tenderness, distention, bowel sounds are normal. Inspection of rectum is unremarkable as is rectal exam. There is normal color stool present which is guaiac positive.    Assessment and Plan: Segmental colitis an  elderly female all maintenance amino salicylate therapy. I have increased her Asacol to 1.2 grams twice a day with continued Rowasa enemas at bedtime as needed. Your recent lab shows a normal CBC. I have scheduled outpatient flexible sigmoidoscopy at her convenience. Her daughter was present throughout the interview and exam today. She is otherwise to continue her medications as per Dr. Earlene Plater.  Please copy her primary care physician, referring physician, and pertinent subspecialists.  Encounter Diagnoses  Name Primary?  . Crohn's   . Rectal bleeding

## 2010-08-14 ENCOUNTER — Other Ambulatory Visit: Payer: MEDICARE | Admitting: Gastroenterology

## 2010-08-30 IMAGING — XA IR ANGIO/EXT/UNI*L*
1 series · 12 of 24 positions shown · IV contrast (visipaque)
Comparison: none

Clinical Data/Indication: LEFT LEG COLD. .

LEFT EXTREMITY ARTERIOGRAPHY
Sedation: Versed three mg, Fentanyl 50 mcg.
Total Moderate Sedation Time:  minutes.
Contrast Volume: 57 ml Visipaque 320.
Additional Medications: None.
Fluoroscopy Time: 4.9 minutes.
Procedure: The procedure, risks, benefits, and alternatives were
explained to the patient. Questions regarding the procedure were
encouraged and answered. The patient understands and consents to
the procedure.
The right groin was prepped withbetadine in a sterile fasion, and a
sterile drape was applied covering the operative field. A sterile
gown and sterile gloves were used for the procedure.
A 19 gauge needle was inserted into the right common femoral artery
and removed over Nguyen Hornung.  A 5-French sheath was inserted.  A 5-
French Omni flush was advanced into the aorta.  Left pelvic
angiography was performed.  It was exchanged over Nguyen Hornung for a
Cobra II catheter.  This was advanced over Nguyen Hornung into the left
external iliac artery.  Left lower extremity runoff was performed.
The catheter was removed.  The sheath was removed.  Hemostasis was
achieved with direct pressure.

[Series 1: run · 12 of 85 slices shown]
[im 4/85]
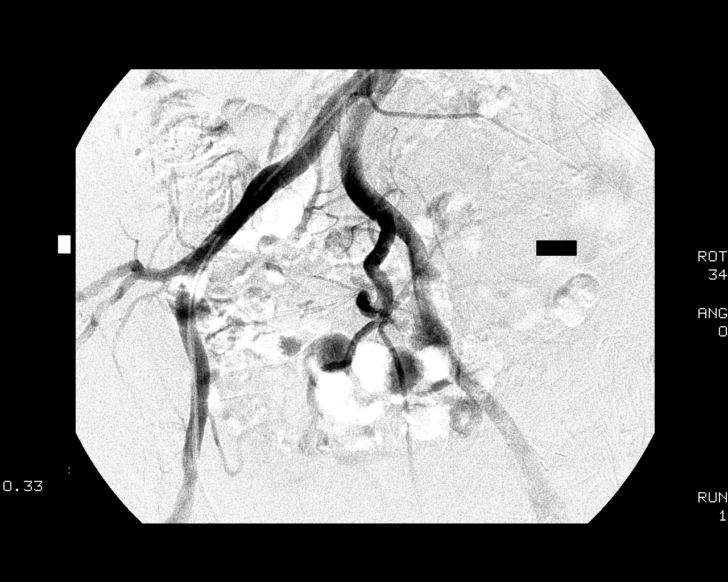
[im 11/85]
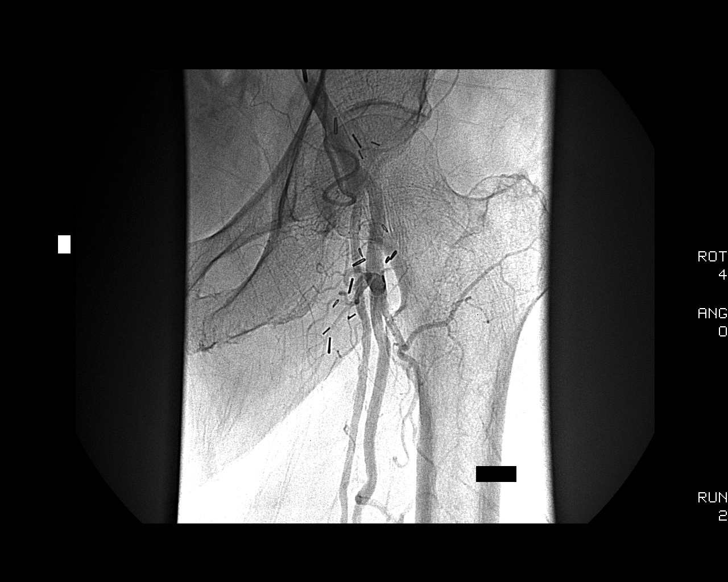
[im 19/85]
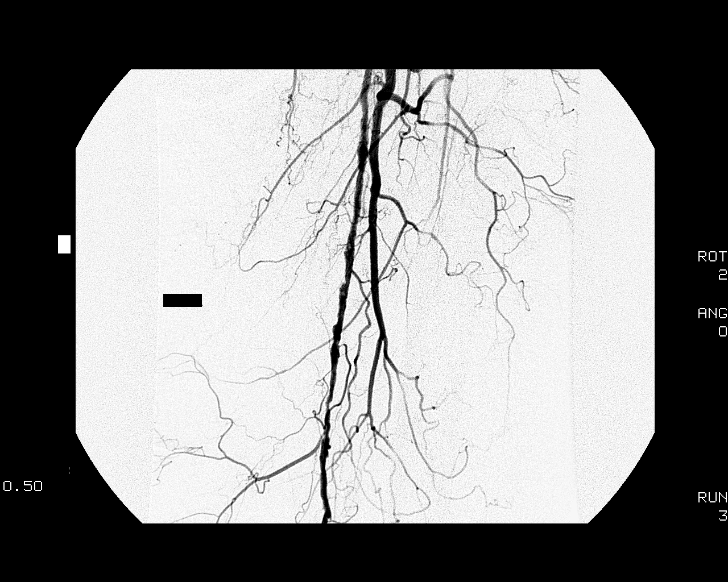
[im 26/85]
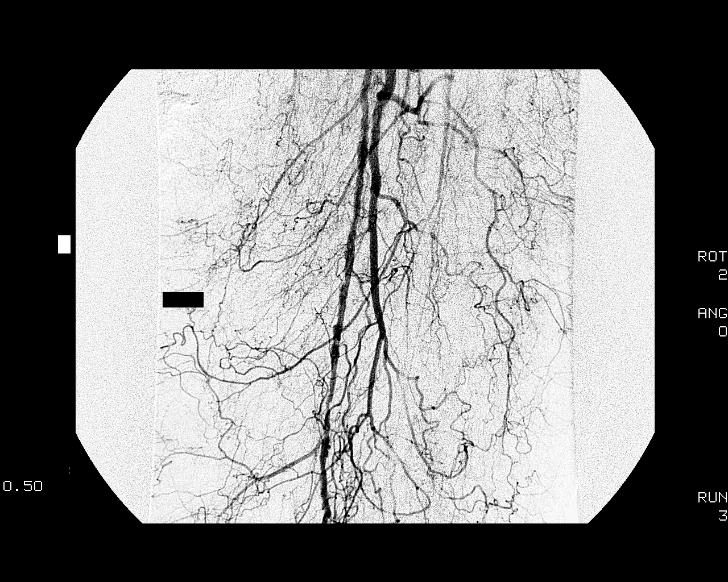
[im 33/85]
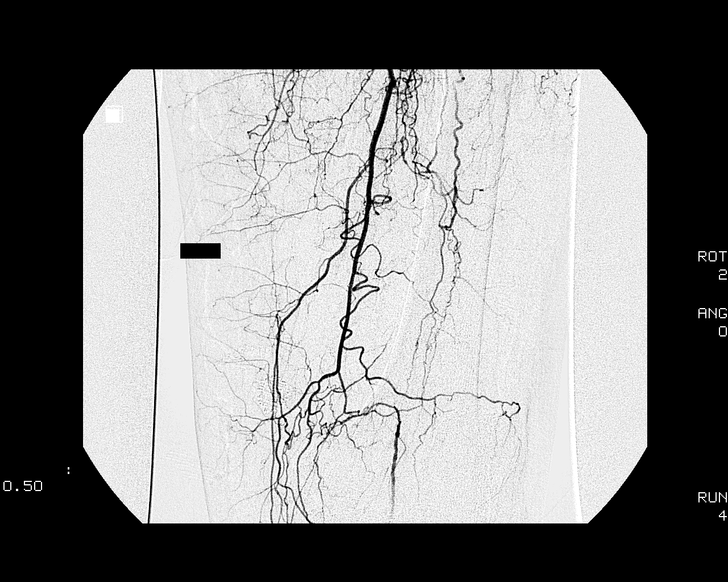
[im 41/85]
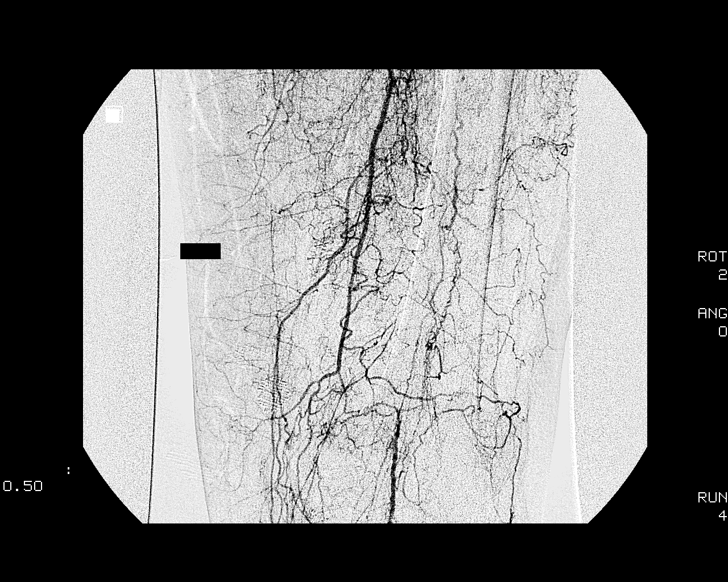
[im 48/85]
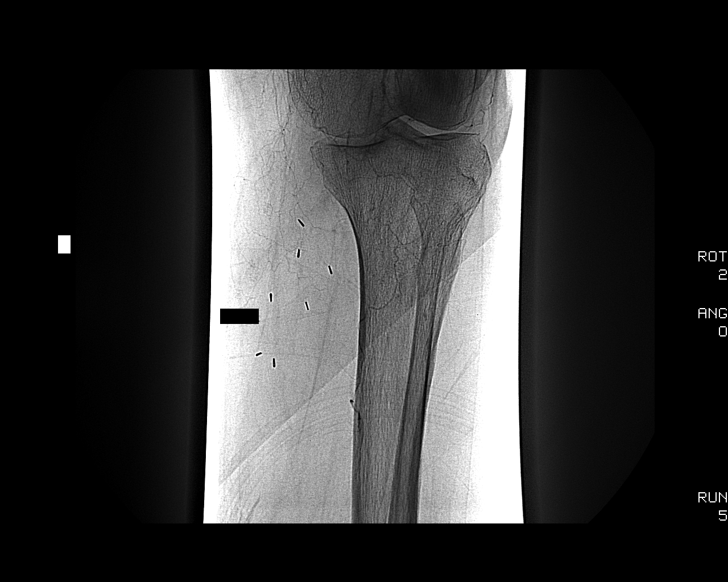
[im 55/85]
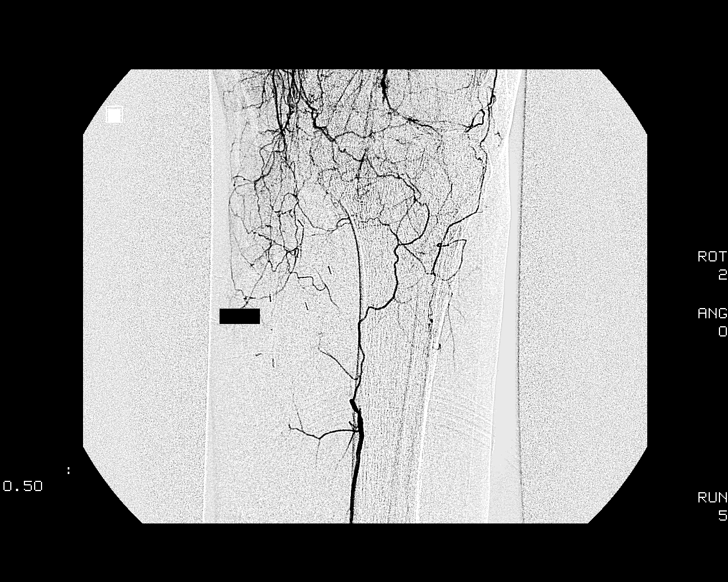
[im 63/85]
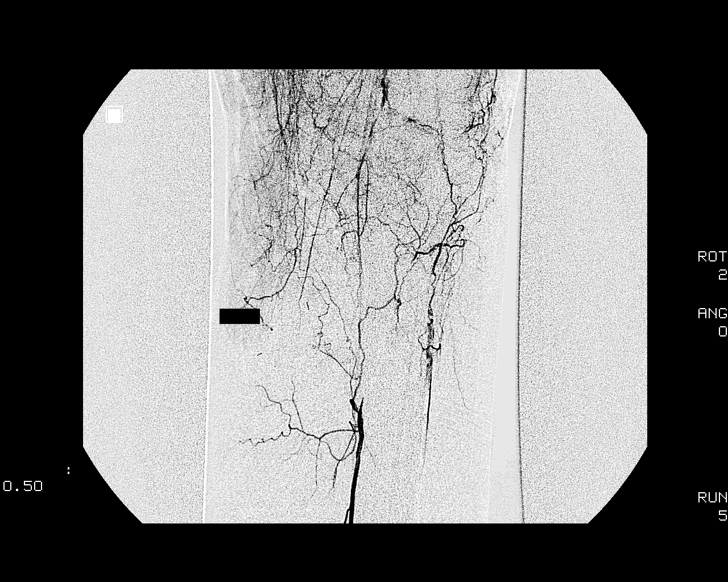
[im 70/85]
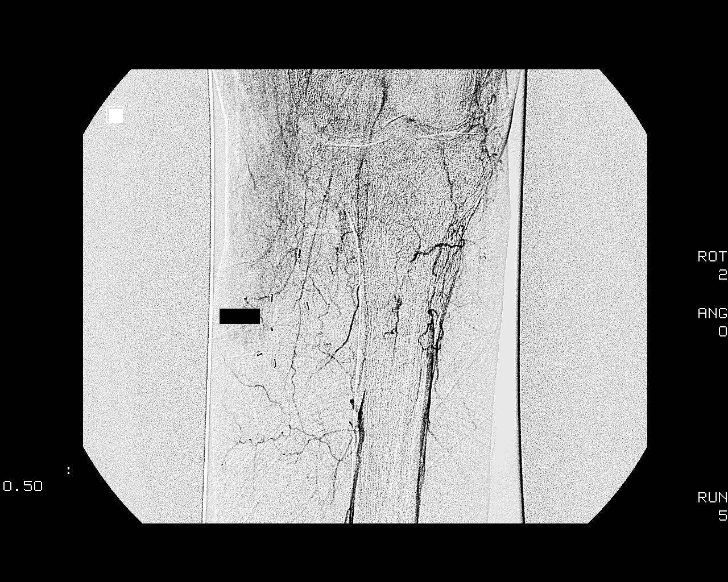
[im 77/85]
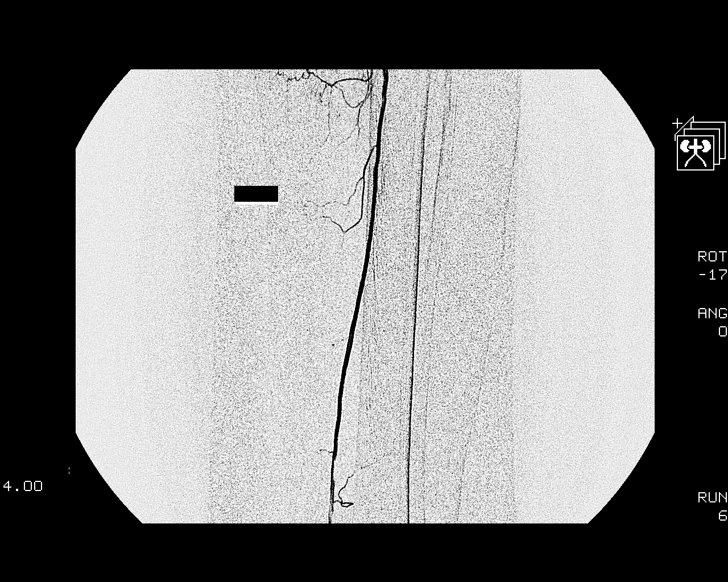
[im 85/85]
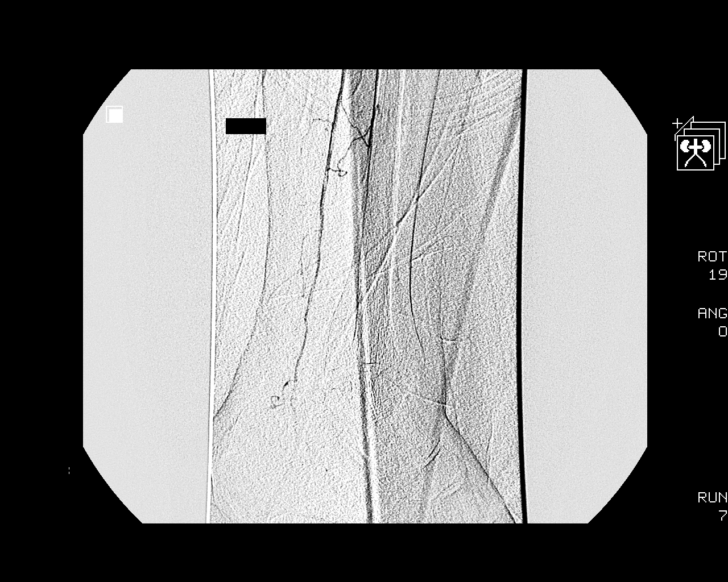

[12 of 24 positions shown; findings below may reference images not displayed]

FINDINGS: The left common, left external, and left internal iliac
arteries are widely patent.  The left profunda femoral and common
femoral arteries are patent.  The left superficial femoral artery
is moderately diseased in the proximal and mid thigh.  It is
occluded at the abductor canal.  A mature collateral is seen
extending just above the level of the occlusion.

The left popliteal artery is severely diseased and only
intermittently opacifies above the knee.

The left posterior tibial artery fills in the proximal calf and is
thereafter patent to the ankle.  The anterior tibial and peroneal
arteries do not definitively opacified.

Complications: None
IMPRESSION: Left iliac arteries are patent.

Occlusion of the distal left superficial femoral artery at the
abductor canal.

The posterior tibial artery reconstitutes and is patent to the
ankle.

Severely diseased popliteal artery without direct flow into the
tibial vessels.

## 2010-08-30 IMAGING — CR DG ANG/EXT/UNI/OR LEFT
1 series · 1 of 1 positions shown · non-contrast
Comparison: 04/16/2008

CLINICAL DATA: Peripheral vascular disease, acute ischemia,
intraoperative angiogram

LEFT EXTREMITY ARTERIOGRAPHY

[view not recorded]
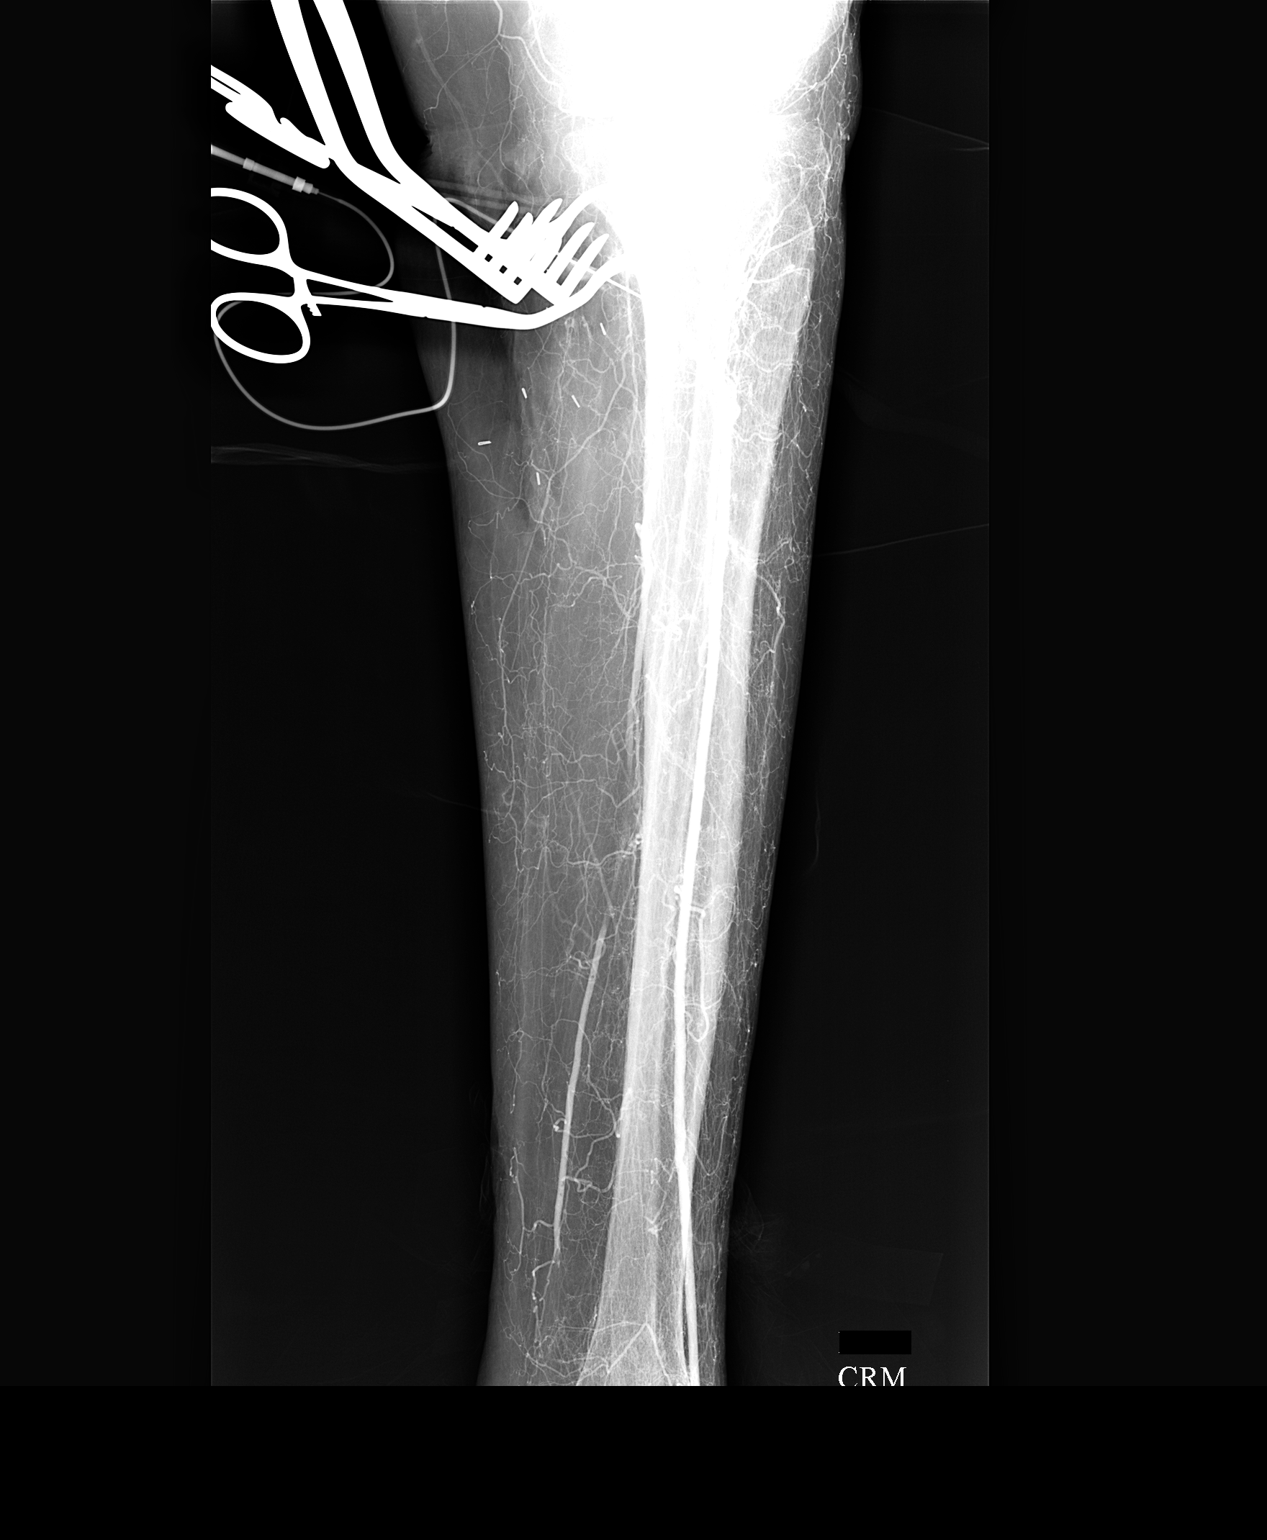

[1 of 1 positions shown; findings below may reference images not displayed]

FINDINGS: Portable intraoperative angiogram was perfor[REDACTED]ed
over the calf.  Injection needle is noted within the tibioperoneal
trunk.  Contrast opacifies the anterior tibial and posterior tibial
arteries.  Filling defects within the posterior tibial artery are
suspicious for peripheral emboli.  Atherosclerotic vascular disease
is also noted proximally.
IMPRESSION: Patent anterior tibial artery to the ankle.
Underfilling of the posterior tibial artery with a truncated
appearance suspicious for distal emboli.

## 2010-08-30 IMAGING — CR DG ANG/EXT/UNI/OR LEFT
1 series · 1 of 1 positions shown · non-contrast
Comparison: 04/16/2008

CLINICAL DATA: Acute ischemia, peripheral vascular disease

LEFT EXTREMITY ARTERIOGRAPHY

[view not recorded]
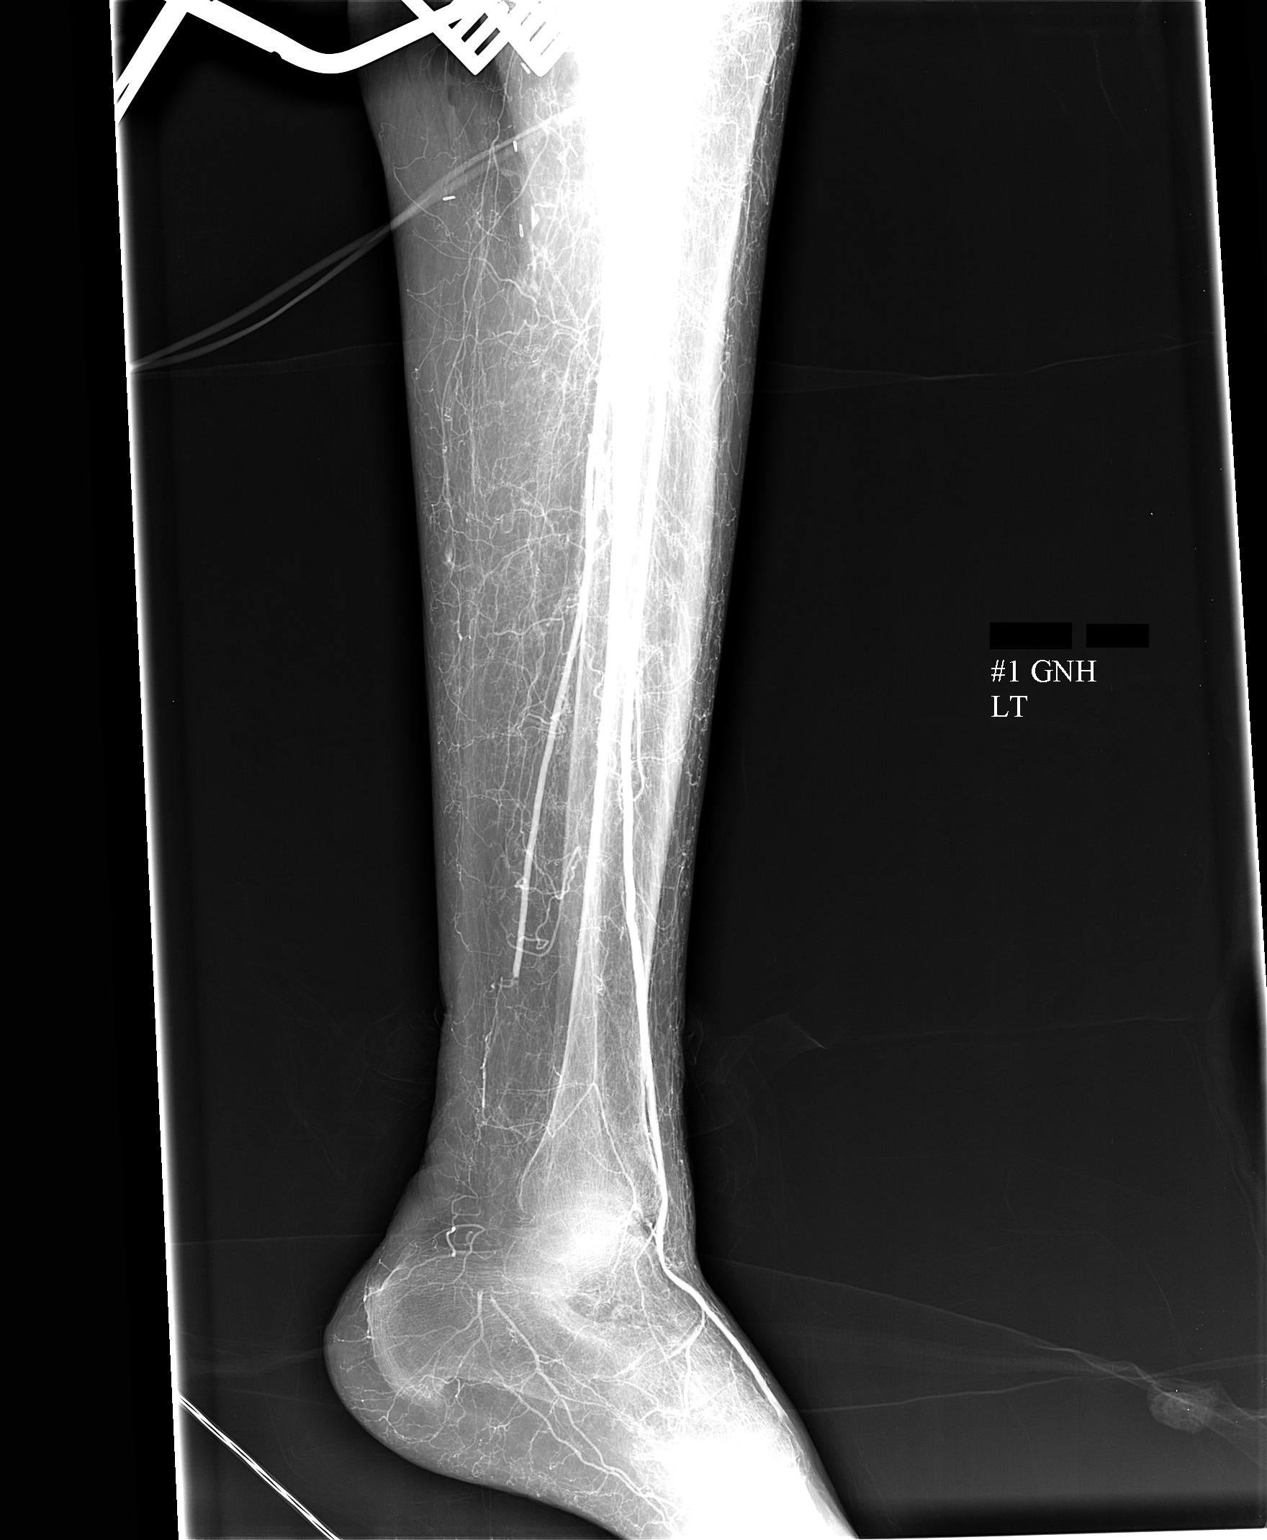

[1 of 1 positions shown; findings below may reference images not displayed]

FINDINGS: Limited intraoperative angiogram was performed of the
left leg centered over the calf.  This demonstrates patency of the
tibial peroneal trunk supplying the anterior tibial and posterior
tibial arteries.  Distally the posterior tibial artery is truncated
suspicious for occlusive embolus.  Dorsalis pedis artery is
visualized across the ankle and poorly opacified distally also
suspicious for distal emboli.
IMPRESSION: Patent anterior tibial and posterior tibial two-vessel runoff with
small distal emboli suspected.

## 2010-08-31 IMAGING — CR DG CHEST 1V PORT
1 series · 1 of 1 positions shown · non-contrast
Comparison: None available.

CLINICAL DATA: [AGE] female status post surgery.

PORTABLE CHEST - 1 VIEW

[view not recorded]
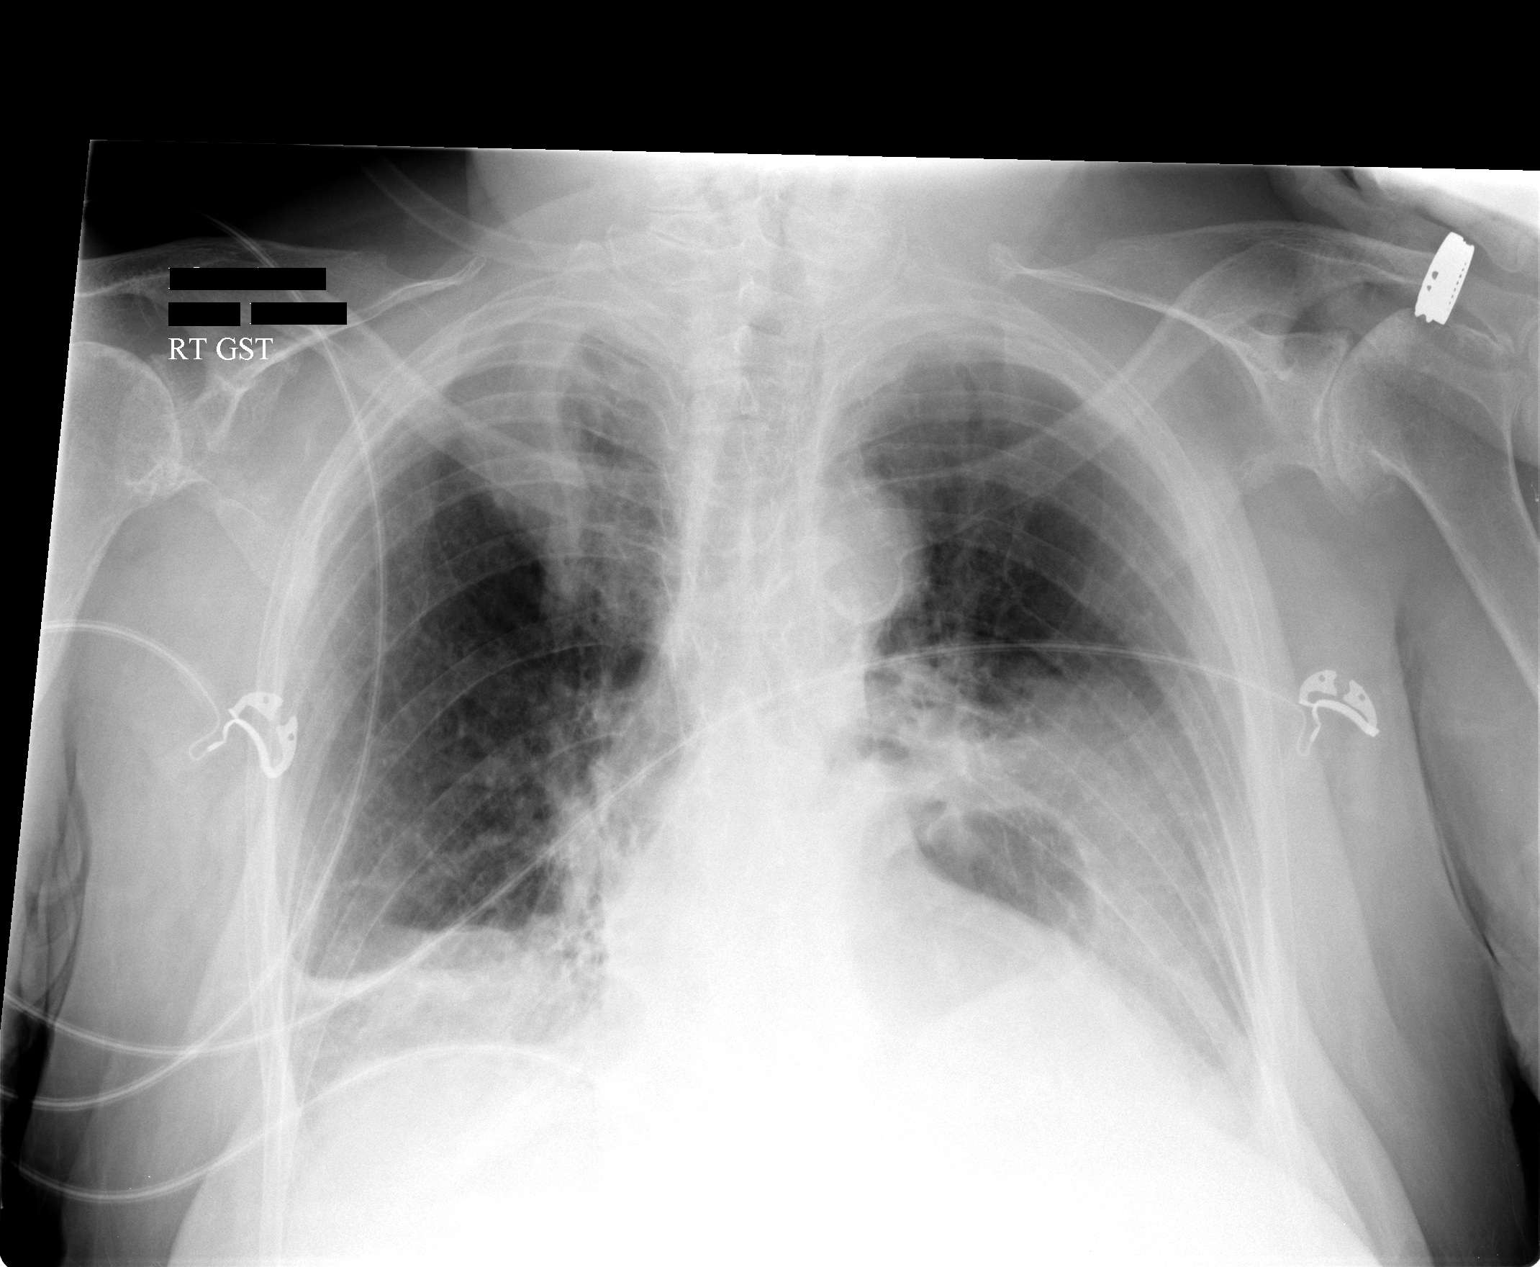

[1 of 1 positions shown; findings below may reference images not displayed]

FINDINGS: Portable semi upright AP view at 9494 hours.  Gas filled
hiatal hernia.  Increased opacity at the left lung base.  No
pneumothorax or pulmonary edema.  More linear increased opacity at
the right lung base.  Degenerative osseous changes at the left
shoulder.  Mild cardiomegaly.
IMPRESSION: 1.  Left greater than right basilar pulmonary opacity could reflect
veiling pleural effusions, or airspace opacity (possibly
atelectasis on the right, but on the left considerations would
include aspiration or consolidation).
2.  Hiatal hernia.

## 2010-09-04 ENCOUNTER — Encounter: Payer: Self-pay | Admitting: Gastroenterology

## 2010-09-04 ENCOUNTER — Ambulatory Visit (AMBULATORY_SURGERY_CENTER): Payer: MEDICARE | Admitting: Gastroenterology

## 2010-09-04 VITALS — BP 147/80 | HR 80 | Temp 98.2°F | Resp 20 | Ht 63.0 in | Wt 139.0 lb

## 2010-09-04 DIAGNOSIS — K625 Hemorrhage of anus and rectum: Secondary | ICD-10-CM

## 2010-09-04 DIAGNOSIS — K5730 Diverticulosis of large intestine without perforation or abscess without bleeding: Secondary | ICD-10-CM

## 2010-09-04 DIAGNOSIS — K5150 Left sided colitis without complications: Secondary | ICD-10-CM

## 2010-09-04 DIAGNOSIS — K5289 Other specified noninfective gastroenteritis and colitis: Secondary | ICD-10-CM

## 2010-09-04 DIAGNOSIS — K5190 Ulcerative colitis, unspecified, without complications: Secondary | ICD-10-CM | POA: Insufficient documentation

## 2010-09-04 MED ORDER — SODIUM CHLORIDE 0.9 % IV SOLN: 500.0000 mL | INTRAVENOUS | Status: DC

## 2010-09-04 NOTE — Patient Instructions (Signed)
Please refer to blue and green discharge instruction sheets. 

## 2010-09-05 ENCOUNTER — Telehealth: Payer: Self-pay | Admitting: *Deleted

## 2010-09-05 NOTE — Telephone Encounter (Signed)

## 2010-09-10 ENCOUNTER — Encounter: Payer: Self-pay | Admitting: Gastroenterology

## 2010-10-07 ENCOUNTER — Telehealth: Payer: Self-pay | Admitting: Gastroenterology

## 2010-10-07 DIAGNOSIS — K5090 Crohn's disease, unspecified, without complications: Secondary | ICD-10-CM

## 2010-10-07 DIAGNOSIS — K625 Hemorrhage of anus and rectum: Secondary | ICD-10-CM

## 2010-10-07 MED ORDER — MESALAMINE 400 MG PO TBEC: DELAYED_RELEASE_TABLET | ORAL | Status: DC

## 2010-10-07 NOTE — Telephone Encounter (Signed)
Advised pt to continue her meds. rx sent

## 2011-01-13 ENCOUNTER — Other Ambulatory Visit: Payer: Self-pay | Admitting: Gastroenterology

## 2011-03-13 ENCOUNTER — Other Ambulatory Visit: Payer: Self-pay | Admitting: Gastroenterology

## 2011-06-19 ENCOUNTER — Other Ambulatory Visit: Payer: Self-pay | Admitting: *Deleted

## 2011-06-19 MED ORDER — MESALAMINE 400 MG PO CPDR: 3.0000 | DELAYED_RELEASE_CAPSULE | Freq: Two times a day (BID) | ORAL | Status: DC

## 2011-06-19 NOTE — Telephone Encounter (Signed)
rx sent to pharm to replace asacol, left message for pt

## 2011-07-14 ENCOUNTER — Telehealth: Payer: Self-pay | Admitting: Gastroenterology

## 2011-07-14 NOTE — Telephone Encounter (Signed)
Called and spoke with [name] and she states that the patient has had rust colored stools for several weeks and some abdominal pain, she had a Canasa rx from 2008. I have advised her not to use the Canasa rx to throw them away. I have advised her to bring pt in tomorrow for Dr [name] to evaluate. Ov made for 07/15/2011 1:30pm.

## 2011-07-15 ENCOUNTER — Other Ambulatory Visit (INDEPENDENT_AMBULATORY_CARE_PROVIDER_SITE_OTHER): Payer: Medicare Other

## 2011-07-15 ENCOUNTER — Encounter: Payer: Self-pay | Admitting: Gastroenterology

## 2011-07-15 ENCOUNTER — Ambulatory Visit (INDEPENDENT_AMBULATORY_CARE_PROVIDER_SITE_OTHER): Payer: Medicare Other | Admitting: Gastroenterology

## 2011-07-15 VITALS — BP 140/60 | HR 80

## 2011-07-15 DIAGNOSIS — K5010 Crohn's disease of large intestine without complications: Secondary | ICD-10-CM

## 2011-07-15 DIAGNOSIS — K5730 Diverticulosis of large intestine without perforation or abscess without bleeding: Secondary | ICD-10-CM

## 2011-07-15 DIAGNOSIS — K5090 Crohn's disease, unspecified, without complications: Secondary | ICD-10-CM

## 2011-07-15 DIAGNOSIS — K3189 Other diseases of stomach and duodenum: Secondary | ICD-10-CM

## 2011-07-15 LAB — CBC WITH DIFFERENTIAL/PLATELET
Basophils Absolute: 0.1 10*3/uL (ref 0.0–0.1)
Basophils Relative: 0.9 % (ref 0.0–3.0)
HCT: 40.2 % (ref 36.0–46.0)
Hemoglobin: 13.2 g/dL (ref 12.0–15.0)
Lymphocytes Relative: 28.1 % (ref 12.0–46.0)
Lymphs Abs: 2.4 10*3/uL (ref 0.7–4.0)
MCHC: 32.8 g/dL (ref 30.0–36.0)
Monocytes Relative: 7 % (ref 3.0–12.0)
Neutro Abs: 5.4 10*3/uL (ref 1.4–7.7)
RBC: 5.52 Mil/uL — ABNORMAL HIGH (ref 3.87–5.11)
RDW: 16.6 % — ABNORMAL HIGH (ref 11.5–14.6)

## 2011-07-15 LAB — IBC PANEL
Saturation Ratios: 8 % — ABNORMAL LOW (ref 20.0–50.0)
Transferrin: 293.5 mg/dL (ref 212.0–360.0)

## 2011-07-15 LAB — FERRITIN: Ferritin: 8.6 ng/mL — ABNORMAL LOW (ref 10.0–291.0)

## 2011-07-15 LAB — FOLATE: Folate: 19.4 ng/mL (ref >5.9)

## 2011-07-15 MED ORDER — MESALAMINE 1000 MG RE SUPP: 1000.0000 mg | Freq: Every day | RECTAL | Status: DC

## 2011-07-15 MED ORDER — MESALAMINE 800 MG PO TBEC: 1.0000 | DELAYED_RELEASE_TABLET | Freq: Two times a day (BID) | ORAL | Status: DC

## 2011-07-15 NOTE — Patient Instructions (Signed)
Take Asacol 800mg  one tablet by mouth twice a day, samples and a rx sent to your pharmacy.  Use Canasa suppository per rectum at bedtime, rx sent to your pharmacy. Please go to the basement today for your labs.

## 2011-07-15 NOTE — Progress Notes (Signed)
This is a delightful 78 year old, soon to be 78 year old Caucasian female who has segmental colitis associated with severe diverticulosis. She also in the past been diagnosed as having marked hyperacidity with possible Zollinger-Ellison syndrome and is on twice a day PPI therapy with good control of her dyspepsia. She has intermittent rectal bleeding which responds to topical aminosialyicate therapy, and has had a recent flare, but is now back to her normal bowel pattern of one to 2 formed stools a day. She denies abdominal pain, nausea vomiting, passage of blood clots, or any symptoms of hypovolemia. She also denies any cardiovascular or pulmonary complaints. For her arthritis she takes Celebrex 100 mg a day, aspirin 325 mg a day, thyroid replacement, and multiple antihypertensives.  Current Medications, Allergies, Past Medical History, Past Surgical History, Family History and Social History were reviewed in [employer] record.  Pertinent Review of Systems Negative   Physical Exam: Healthy appearing female who appears much younger than her stated age blood pressure today 140/60 and pulse 80 and regular. I cannot appreciate hepatosplenomegaly, abdominal masses or tenderness. Inspection of her perianal area is unremarkable as is rectal exam. There no active or bleeding hemorrhoids, fissures, fistula. There is formed stool in the rectal vault that is trace guaiac positive. Mental status is normal.    Assessment and Plan: This patient had flexible sigmoidoscopy within the last year, and has had several colonoscopies. She has severe diverticulosis with segmental colitis, and apparently cannot tolerate Rowasa enemas. I have changed her medications to  Asacol 800 mg twice a day with 1 g Canasa suppositories at bedtime. She also is to continue her twice a day Nexium. CBC an anemia profile ordered for review. I've also asked her not to use by mouth sorbitol or fructose per her dry mouth. I  do not think we need to repeat her colonoscopy at this time. Serum gastrin level also ordered. She is to continue a high fiber diet with liberal by mouth fluids as tolerated. Encounter Diagnosis  Name Primary?  . Crohn's disease Yes

## 2011-07-17 ENCOUNTER — Telehealth: Payer: Self-pay | Admitting: *Deleted

## 2011-07-17 ENCOUNTER — Telehealth: Payer: Self-pay | Admitting: Gastroenterology

## 2011-07-17 DIAGNOSIS — K5010 Crohn's disease of large intestine without complications: Secondary | ICD-10-CM

## 2011-07-17 MED ORDER — FERROUS FUM-IRON POLYSACCH-FA 162-115.2-1 MG PO CAPS: 1.0000 | ORAL_CAPSULE | Freq: Every day | ORAL | Status: DC

## 2011-07-17 NOTE — Telephone Encounter (Signed)
Pt's Gastrin test was not performed by the lab. Apparently, the lab called the pt. Pt's daughter, [name] is willing to take the pt to her PCP to have lab drawn. Also, per Dr [name], she is to take Tandem daily for 3 months, then repeat a CBC.

## 2011-07-17 NOTE — Telephone Encounter (Signed)
Dr [name]- We got a note from patient's insurance that they will not cover Asacol 800 mg as prescribed recently for patient. Per your note, she was put on this because she could not tolerate rowasa. Insurance will cover sulfasalazine, Balsalazide, Apriso or Lialda. Would one of these be an appropriate alternative for one of these patients? If so, which one and what will the sig be?

## 2011-07-17 NOTE — Telephone Encounter (Signed)
Faxed Gastrin lab order to [facility] practice at [phone].

## 2011-07-18 MED ORDER — MESALAMINE 1.2 G PO TBEC: DELAYED_RELEASE_TABLET | ORAL | Status: DC

## 2011-07-18 NOTE — Telephone Encounter (Signed)
Informed [employer], we changed the order from Asacol to Lialda; they will call for problems.

## 2011-07-18 NOTE — Telephone Encounter (Signed)
liala 2 a day

## 2011-07-21 ENCOUNTER — Telehealth: Payer: Self-pay | Admitting: *Deleted

## 2011-07-21 NOTE — Telephone Encounter (Signed)
lmom for pt to call back

## 2011-07-22 NOTE — Telephone Encounter (Signed)
Informed pt's daughter, Jonae Renshaw that pt's Gastrin level was very high and Dr [name] would like for her to increase the Nexium to BID. She reports pt used to try to take it BID, but had slipped. She will try to get her mom back on a BID regime. We also discussed pt's labs to answer her question on why pt was placed on IRON; she stated understanding.

## 2011-07-30 ENCOUNTER — Telehealth: Payer: Self-pay | Admitting: Gastroenterology

## 2011-07-30 NOTE — Telephone Encounter (Signed)
USE ROWASA ENEMAS  QHS PRN.Marland Kitchen

## 2011-07-30 NOTE — Telephone Encounter (Signed)
Informed pt to stop the Lialda and begin Rowasa Enemas. Pt had difficulty understanding, so she will have her daughter [name] call me.

## 2011-07-30 NOTE — Telephone Encounter (Signed)
Last OV 07/15/11. Insurance will not pay for Asacol HD and pt was put on Lialda and also Tandem. Pt reports she starting the Lialda about 3 days ago and since then she reports diarrhea and cramping. ( I spoke with pt, daughter was out, so I guess she is a good historian)  Please advise. Thanks.

## 2011-07-31 ENCOUNTER — Telehealth: Payer: Self-pay | Admitting: Gastroenterology

## 2011-07-31 MED ORDER — BALSALAZIDE DISODIUM 750 MG PO CAPS: ORAL_CAPSULE | ORAL | Status: DC

## 2011-07-31 NOTE — Telephone Encounter (Signed)
Spoke to pts daughter and explained to her that since the patient has not failed any of the insurance alts which are Sulfalazine, balsalazide, apriso. Patient was previously on Colazal 750mg  three tabs bid and in 2011 insurance would not cover it any longer. I have sent in a rx for Colazal. I have called [facility] and rx prior auth and they state that the rx is a covered drug

## 2011-07-31 NOTE — Telephone Encounter (Signed)
Discussed pt's diarrhea with her daughter, Scarlette Calico. Instructed pt to stop the Lialda and hopefully the diarrhea will subside; instructed to begin the rowasa which Scarlette Calico states they have on hand. Scarlette Calico states she called [employer] for an appeal and she is faxing [place] a form. Informed her when called previously, there were several drugs that must be tried before Asacol HD would be approved; pt has failed with Lialda and balsalazide.

## 2011-08-03 ENCOUNTER — Telehealth: Payer: Self-pay | Admitting: Internal Medicine

## 2011-08-03 NOTE — Telephone Encounter (Signed)
Diarrhea problems since changing to Lialda. Intensifying. Went to [facility] ED.  Call at 3 AM today - Ct showed mild colon wall thickening in right colon, WBC was normal. Not toxic, did not need admission.  Advised to hold Lialda. ED MD gave 125 mg Solumedrol and patient to start 40 mg prednisone daily.  Advised we would call and arrange follow-up and further treatment.

## 2011-08-04 NOTE — Telephone Encounter (Signed)
See me

## 2011-08-04 NOTE — Telephone Encounter (Signed)
Pt scheduled to see Dr. [name] tomorrow at 1:45pm. Spoke with pts daughter and she is aware of appt date and time.

## 2011-08-05 ENCOUNTER — Inpatient Hospital Stay (HOSPITAL_COMMUNITY)
Admission: AD | Admit: 2011-08-05 | Discharge: 2011-08-10 | DRG: 372 | Disposition: A | Discharge status: Home / Self Care | Payer: Medicare Other | Source: Ambulatory Visit | Attending: Gastroenterology | Admitting: Gastroenterology

## 2011-08-05 ENCOUNTER — Encounter (HOSPITAL_COMMUNITY): Payer: Self-pay | Admitting: *Deleted

## 2011-08-05 ENCOUNTER — Inpatient Hospital Stay (HOSPITAL_COMMUNITY): Payer: Medicare Other

## 2011-08-05 ENCOUNTER — Ambulatory Visit (INDEPENDENT_AMBULATORY_CARE_PROVIDER_SITE_OTHER): Payer: Medicare Other | Admitting: Gastroenterology

## 2011-08-05 ENCOUNTER — Encounter: Payer: Self-pay | Admitting: Gastroenterology

## 2011-08-05 VITALS — BP 110/60 | HR 76

## 2011-08-05 DIAGNOSIS — K523 Indeterminate colitis: Secondary | ICD-10-CM

## 2011-08-05 DIAGNOSIS — A0472 Enterocolitis due to Clostridium difficile, not specified as recurrent: Secondary | ICD-10-CM

## 2011-08-05 DIAGNOSIS — I4949 Other premature depolarization: Secondary | ICD-10-CM | POA: Diagnosis present

## 2011-08-05 DIAGNOSIS — E669 Obesity, unspecified: Secondary | ICD-10-CM | POA: Diagnosis present

## 2011-08-05 DIAGNOSIS — E871 Hypo-osmolality and hyponatremia: Secondary | ICD-10-CM | POA: Diagnosis present

## 2011-08-05 DIAGNOSIS — K449 Diaphragmatic hernia without obstruction or gangrene: Secondary | ICD-10-CM | POA: Diagnosis present

## 2011-08-05 DIAGNOSIS — K219 Gastro-esophageal reflux disease without esophagitis: Secondary | ICD-10-CM | POA: Diagnosis present

## 2011-08-05 DIAGNOSIS — Z87440 Personal history of urinary (tract) infections: Secondary | ICD-10-CM

## 2011-08-05 DIAGNOSIS — E164 Increased secretion of gastrin: Secondary | ICD-10-CM

## 2011-08-05 DIAGNOSIS — E785 Hyperlipidemia, unspecified: Secondary | ICD-10-CM | POA: Diagnosis present

## 2011-08-05 DIAGNOSIS — K5730 Diverticulosis of large intestine without perforation or abscess without bleeding: Secondary | ICD-10-CM | POA: Diagnosis present

## 2011-08-05 DIAGNOSIS — K529 Noninfective gastroenteritis and colitis, unspecified: Secondary | ICD-10-CM

## 2011-08-05 DIAGNOSIS — I4510 Unspecified right bundle-branch block: Secondary | ICD-10-CM | POA: Diagnosis present

## 2011-08-05 DIAGNOSIS — I2510 Atherosclerotic heart disease of native coronary artery without angina pectoris: Secondary | ICD-10-CM | POA: Diagnosis present

## 2011-08-05 DIAGNOSIS — K5010 Crohn's disease of large intestine without complications: Secondary | ICD-10-CM | POA: Diagnosis present

## 2011-08-05 DIAGNOSIS — R5381 Other malaise: Secondary | ICD-10-CM | POA: Diagnosis present

## 2011-08-05 DIAGNOSIS — J449 Chronic obstructive pulmonary disease, unspecified: Secondary | ICD-10-CM | POA: Diagnosis present

## 2011-08-05 DIAGNOSIS — J441 Chronic obstructive pulmonary disease with (acute) exacerbation: Secondary | ICD-10-CM

## 2011-08-05 DIAGNOSIS — K5289 Other specified noninfective gastroenteritis and colitis: Secondary | ICD-10-CM

## 2011-08-05 DIAGNOSIS — J4489 Other specified chronic obstructive pulmonary disease: Secondary | ICD-10-CM | POA: Diagnosis present

## 2011-08-05 HISTORY — DX: Polyneuropathy, unspecified: G62.9

## 2011-08-05 LAB — COMPREHENSIVE METABOLIC PANEL
ALT: 17 U/L (ref 0–35)
AST: 17 U/L (ref 0–37)
CO2: 24 mEq/L (ref 19–32)
Chloride: 96 mEq/L (ref 96–112)
GFR calc non Af Amer: 75 mL/min — ABNORMAL LOW (ref >90)
Sodium: 130 mEq/L — ABNORMAL LOW (ref 135–145)
Total Bilirubin: 0.2 mg/dL — ABNORMAL LOW (ref 0.3–1.2)

## 2011-08-05 LAB — CBC
Platelets: 377 10*3/uL (ref 150–400)
RBC: 5.42 MIL/uL — ABNORMAL HIGH (ref 3.87–5.11)
WBC: 9 10*3/uL (ref 4.0–10.5)

## 2011-08-05 MED ORDER — POLYETHYL GLYCOL-PROPYL GLYCOL 0.4-0.3 % OP SOLN: 2.0000 [drp] | Freq: Every morning | OPHTHALMIC | Status: DC

## 2011-08-05 MED ORDER — ONDANSETRON HCL 4 MG PO TABS: 4.0000 mg | ORAL_TABLET | Freq: Three times a day (TID) | ORAL | Status: DC | PRN

## 2011-08-05 MED ORDER — FLUTICASONE-SALMETEROL 100-50 MCG/DOSE IN AEPB
1.0000 | INHALATION_SPRAY | Freq: Two times a day (BID) | RESPIRATORY_TRACT | Status: DC
  Administered 2011-08-05 – 2011-08-10 (×10): 1 via RESPIRATORY_TRACT
  Filled 2011-08-05: qty 14

## 2011-08-05 MED ORDER — ASPIRIN 325 MG PO TABS
325.0000 mg | ORAL_TABLET | Freq: Every day | ORAL | Status: DC
  Administered 2011-08-06 – 2011-08-10 (×5): 325 mg via ORAL
  Filled 2011-08-05 (×5): qty 1

## 2011-08-05 MED ORDER — AMLODIPINE BESYLATE 5 MG PO TABS
5.0000 mg | ORAL_TABLET | Freq: Every day | ORAL | Status: DC
  Administered 2011-08-06 – 2011-08-10 (×5): 5 mg via ORAL
  Filled 2011-08-05 (×5): qty 1

## 2011-08-05 MED ORDER — GABAPENTIN 300 MG PO CAPS
600.0000 mg | ORAL_CAPSULE | Freq: Every day | ORAL | Status: DC
  Administered 2011-08-05 – 2011-08-09 (×5): 600 mg via ORAL
  Filled 2011-08-05 (×6): qty 2

## 2011-08-05 MED ORDER — ALBUTEROL SULFATE HFA 108 (90 BASE) MCG/ACT IN AERS
2.0000 | INHALATION_SPRAY | Freq: Four times a day (QID) | RESPIRATORY_TRACT | Status: DC
  Administered 2011-08-05 – 2011-08-10 (×19): 2 via RESPIRATORY_TRACT
  Filled 2011-08-05: qty 6.7

## 2011-08-05 MED ORDER — SODIUM CHLORIDE 0.9 % IV SOLN
INTRAVENOUS | Status: DC
  Administered 2011-08-05 – 2011-08-08 (×4): via INTRAVENOUS
  Administered 2011-08-10: 1000 mL via INTRAVENOUS

## 2011-08-05 MED ORDER — POLYVINYL ALCOHOL 1.4 % OP SOLN
2.0000 [drp] | Freq: Every day | OPHTHALMIC | Status: DC
  Administered 2011-08-06 – 2011-08-10 (×5): 2 [drp] via OPHTHALMIC
  Filled 2011-08-05: qty 15

## 2011-08-05 MED ORDER — GABAPENTIN 600 MG PO TABS: 600.0000 mg | ORAL_TABLET | Freq: Every day | ORAL | Status: DC

## 2011-08-05 MED ORDER — BALSALAZIDE DISODIUM 750 MG PO CAPS: 2250.0000 mg | ORAL_CAPSULE | Freq: Two times a day (BID) | ORAL | Status: DC

## 2011-08-05 MED ORDER — METOPROLOL TARTRATE 25 MG PO TABS
25.0000 mg | ORAL_TABLET | Freq: Two times a day (BID) | ORAL | Status: DC
  Administered 2011-08-05 – 2011-08-10 (×10): 25 mg via ORAL
  Filled 2011-08-05 (×11): qty 1

## 2011-08-05 MED ORDER — FLUTICASONE PROPIONATE 50 MCG/ACT NA SUSP
1.0000 | Freq: Two times a day (BID) | NASAL | Status: DC
  Administered 2011-08-05 – 2011-08-10 (×9): 1 via NASAL
  Filled 2011-08-05: qty 16

## 2011-08-05 MED ORDER — OXYCODONE-ACETAMINOPHEN 5-325 MG PO TABS: 1.0000 | ORAL_TABLET | Freq: Four times a day (QID) | ORAL | Status: DC | PRN

## 2011-08-05 MED ORDER — GABAPENTIN 300 MG PO CAPS
300.0000 mg | ORAL_CAPSULE | Freq: Every day | ORAL | Status: DC
  Administered 2011-08-06 – 2011-08-10 (×5): 300 mg via ORAL
  Filled 2011-08-05 (×5): qty 1

## 2011-08-05 MED ORDER — PREDNISONE 20 MG PO TABS
40.0000 mg | ORAL_TABLET | Freq: Every day | ORAL | Status: DC
  Administered 2011-08-06: 40 mg via ORAL
  Filled 2011-08-05 (×2): qty 2

## 2011-08-05 MED ORDER — METRONIDAZOLE IN NACL 5-0.79 MG/ML-% IV SOLN
500.0000 mg | Freq: Three times a day (TID) | INTRAVENOUS | Status: DC
  Administered 2011-08-05 – 2011-08-10 (×15): 500 mg via INTRAVENOUS
  Filled 2011-08-05 (×16): qty 100

## 2011-08-05 MED ORDER — PANTOPRAZOLE SODIUM 40 MG PO TBEC
40.0000 mg | DELAYED_RELEASE_TABLET | Freq: Two times a day (BID) | ORAL | Status: DC
  Administered 2011-08-05 – 2011-08-06 (×2): 40 mg via ORAL
  Filled 2011-08-05 (×4): qty 1

## 2011-08-05 NOTE — Progress Notes (Signed)
Patient admitted from office to room 1521 (See H&P by Dr. [name]). Patient has stable vital signs. She is mildly short of breath as mentioned in H&P. Will get CXR, start 02 per [place]. See admission orders but in summary: check labs, hydrate her, check stool studies, treat empirically (?infectious colitis) with Flagyl. For now will continue the Prednisone which was started in ED in [place] on Saturday.  If this turns out infectious or ischemic will look to rapidly taper the steroids. Depending on clinical course she may need CTscan of abd/pelvis but daughter states she had one in [place] on Saturday. Will check CXR results, continue home pulmonary meds. Depending on clinical course we may need to consult pulmonary.    Willette Cluster, NP-C / Dr. [name]

## 2011-08-05 NOTE — Progress Notes (Signed)
This is a very complicated rectal bleeding resulting in recent hospitalization and [facility]. At that time she was dehydrated and hypokalemic. 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 She is on chronic PPI at therapy. The patient in the past has responded to oral and topical amino salicylates, but with recent abdominal cramping, diarrhea, and rectal bleeding, she was placed on prednisone therapy which has not resulted in much improvement. She has had diarrhea for the last 24-hours  and complains of shortness of breath and dyspnea on exertion. She denies palpitations, chest pain or significant abdominal pain. The patient was on antibiotics earlier this year, and I cannot see a C. difficile report. She currently by mistake is on large doses of prednisone per her daughter. Other medications are listed and reviewed.   Current Medications, Allergies, Past Medical History, Past Surgical History, Family History and Social History were reviewed in [employer] record.  Pertinent Review of Systems Negative Allergies  Allergen Reactions  . Amoxicillin     REACTION: unspecified  . Codeine Phosphate     REACTION: unspecified  . Ivp Dye (Iodinated Diagnostic Agents) Other (See Comments)    Made her feel like she was smothering  . Levofloxacin Hives  . Moxifloxacin   . Penicillins    Outpatient Prescriptions Prior to Visit  Medication Sig Dispense Refill  . ALBUTEROL IN Inhale 1 puff into the lungs 2 (two) times daily.        Marland Kitchen amLODipine (NORVASC) 5 MG tablet Take 5 mg by mouth daily.        Marland Kitchen aspirin 325 MG tablet Take 325 mg by mouth daily.        . AZO-CRANBERRY PO Take 2 capsules by mouth daily.        . celecoxib (CELEBREX) 100 MG capsule Take 100 mg by mouth daily.        Marland Kitchen esomeprazole (NEXIUM) 40 MG capsule Take 40 mg by mouth 2 (two) times daily.         . Ferrous Fum-Iron Polysacch-FA (TANDEM F) 162-115.2-1 MG CAPS Take 1 capsule by mouth daily.  30 each  3  . Fluticasone-Salmeterol (ADVAIR DISKUS) 100-50 MCG/DOSE AEPB Inhale 1 puff into the lungs every 12 (twelve) hours.        . gabapentin (NEURONTIN) 300 MG capsule Take one tablet in the morning and take two tablets at bedtime.       . hyoscyamine (ANASPAZ) 0.125 MG TBDP Place 0.125 mg under the tongue. Three as needed once daily       . levothyroxine (SYNTHROID, LEVOTHROID) 50 MCG tablet Take 50 mcg by mouth daily.        . metoprolol tartrate (LOPRESSOR) 25 MG tablet Take 25 mg by mouth 2 (two) times daily.        . Multiple Vitamin (MULTIVITAMIN) capsule Take 1 capsule by mouth daily.        Marland Kitchen NASONEX 50 MCG/ACT nasal spray       . nitroGLYCERIN (NITROSTAT) 0.4 MG SL tablet Place 0.4 mg under the tongue every 5 (five) minutes as needed.        . Omega-3 Fatty Acids (FISH OIL) 1000 MG CAPS Take 1 capsule by mouth 2 (two) times daily.        . Phenazopyridine HCl (AZO TABS PO) Take by mouth. As directed, for bladder       . Polyethyl Glycol-Propyl  Glycol (SYSTANE OP) Apply to eye. As directed       . PROAIR HFA 108 (90 BASE) MCG/ACT inhaler       . Psyllium 28 % POWD Take one teaspoon once a day       . balsalazide (COLAZAL) 750 MG capsule Take three tablets by mouth twice a day  180 capsule  1  . HYDROcodone-acetaminophen (NORCO) 10-325 MG per tablet Take 1 tablet by mouth as needed.        . mesalamine (CANASA) 1000 MG suppository Place 1 suppository (1,000 mg total) rectally at bedtime.  30 suppository  6  . mesalamine (LIALDA) 1.2 G EC tablet Take 2 tablets by mouth daily with breakfast  60 tablet  2   Facility-Administered Medications Prior to Visit  Medication Dose Route Frequency Provider Last Rate Last Dose  . 0.9 %  sodium chloride infusion  500 mL Intravenous Continuous Mardella Layman, MD       Past Medical History  Diagnosis Date  . Spinal stenosis, unspecified region  other than cervical   . Urinary tract infection, site not specified   . Unspecified hypothyroidism   . Other and unspecified hyperlipidemia   . Arthropathy, unspecified, site unspecified   . Esophageal reflux   . Hiatal hernia   . Coronary atherosclerosis of unspecified type of vessel, native or graft   . Peripheral vascular disease, unspecified   . Unspecified asthma   . Diverticulosis of colon (without mention of hemorrhage)   . Crohn's   . PVC's (premature ventricular contractions)   . Hypertension   . Zollinger-Ellison syndrome   . Hearing loss   . Murmur   . Sinus trouble    Past Surgical History  Procedure Date  . Appendectomy 1955  . Carpal tunnel release 2009    right  . Knee arthroscopy 1994    right  . Femoral artery - popliteal artery bypass graft 1992  . Angioplasty 02/1997    X 3 stents  . Stapedectomy 1976    left  . Dilation and curettage of uterus   . Cervical polypectomy   . Leg surgery     left  . Cataract extraction   . Colonoscopy   . Polypectomy    History   Social History  . Marital Status: Widowed    Spouse Name: N/A    Number of Children: N/A  . Years of Education: N/A   Occupational History  . retired    Social History Main Topics  . Smoking status: Never Smoker   . Smokeless tobacco: Never Used  . Alcohol Use: No  . Drug Use: No  . Sexually Active: None   Other Topics Concern  . None   Social History Narrative  . None   Family History  Problem Relation Age of Onset  . Colon cancer Sister   . Colon cancer Father   . Diabetes Daughter   . Heart disease Mother   . Diabetes Brother     X2  . Heart disease Brother     X2       Physical Exam: Elderly appearing patient in no acute distress. Vital signs are stable. I cannot appreciate stigmata of chronic liver disease. She has kyphoscoliosis. Chest exam shows rhonchi in both lung fields. She appear to be in a regular rhythm with a soft systolic ejection murmur. Her abdomen  shows no definite organomegaly, masses or tenderness. Bowel sounds are normal. Rectal exam shows soft liquid stool which is  not bloody but is markedly guaiac positive. Mental status is normal.    Assessment and Plan: Possible flare of chronic left-sided inflammatory bowel disease, rule out C. difficile infection, ischemic colitis, or infectious colitis. She needs hospitalization for fluid and electrolyte correction, oxygen administration, stool exams, and perhaps followup flexible sigmoidoscopy. 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 She also may need pulmonary consultation per her COPD and current respiratory insufficiency. No diagnosis found.

## 2011-08-05 NOTE — Patient Instructions (Signed)
You are being admitted to [facility]. Please go to admitting. Our Nurse Practitioner, Willette Cluster is aware that you are coming. You will be going to [address] [place].

## 2011-08-05 NOTE — H&P (Signed)
Primary Care Physician:  Carmin Richmond, MD, MD Primary Gastroenterologist:  Dr. Sheryn Bison   *This H&P was done by Dr. [name] in the office, cut and pasted to this admission note.   Sheryn Bison, MD  08/05/2011  2:38 PM  Pended  HPI: This is a very complicated rectal bleeding resulting in recent hospitalization and [facility]. At that time she was dehydrated and hypokalemic. The patient has a long history of segmental colitis associated with diverticulosis, also [name] syndrome with recent serum gastrin over thousand. She is on chronic PPI at therapy. The patient in the past has responded to oral and topical amino salicylates, but with recent abdominal cramping, diarrhea, and rectal bleeding, she was placed on prednisone therapy which has not resulted in much improvement. She has had diarrhea for the last 24-hours  and complains of shortness of breath and dyspnea on exertion. She denies palpitations, chest pain or significant abdominal pain. The patient was on antibiotics earlier this year, and I cannot see a C. difficile report. She currently by mistake is on large doses of prednisone per her daughter. Other medications are listed and reviewed.     Pertinent Review of Systems Negative Allergies   Allergen  Reactions   .  Amoxicillin         REACTION: unspecified   .  Codeine Phosphate         REACTION: unspecified   .  Ivp Dye (Iodinated Diagnostic Agents)  Other (See Comments)       Made her feel like she was smothering   .  Levofloxacin  Hives   .  Moxifloxacin     .  Penicillins      Outpatient Prescriptions Prior to Visit   Medication  Sig  Dispense  Refill   .  ALBUTEROL IN  Inhale 1 puff into the lungs 2 (two) times daily.           Marland Kitchen  amLODipine (NORVASC) 5 MG tablet  Take 5 mg by mouth daily.           Marland Kitchen  aspirin 325 MG tablet  Take 325 mg by mouth daily.           .  AZO-CRANBERRY PO  Take 2 capsules by mouth daily.           .  celecoxib (CELEBREX)  100 MG capsule  Take 100 mg by mouth daily.           Marland Kitchen  esomeprazole (NEXIUM) 40 MG capsule  Take 40 mg by mouth 2 (two) times daily.          .  Ferrous Fum-Iron Polysacch-FA (TANDEM F) 162-115.2-1 MG CAPS  Take 1 capsule by mouth daily.   30 each   3   .  Fluticasone-Salmeterol (ADVAIR DISKUS) 100-50 MCG/DOSE AEPB  Inhale 1 puff into the lungs every 12 (twelve) hours.           .  gabapentin (NEURONTIN) 300 MG capsule  Take one tablet in the morning and take two tablets at bedtime.          .  hyoscyamine (ANASPAZ) 0.125 MG TBDP  Place 0.125 mg under the tongue. Three as needed once daily          .  levothyroxine (SYNTHROID, LEVOTHROID) 50 MCG tablet  Take 50 mcg by mouth daily.           .  metoprolol tartrate (LOPRESSOR) 25 MG tablet  Take 25  mg by mouth 2 (two) times daily.           .  Multiple Vitamin (MULTIVITAMIN) capsule  Take 1 capsule by mouth daily.           Marland Kitchen  NASONEX 50 MCG/ACT nasal spray           .  nitroGLYCERIN (NITROSTAT) 0.4 MG SL tablet  Place 0.4 mg under the tongue every 5 (five) minutes as needed.           .  Omega-3 Fatty Acids (FISH OIL) 1000 MG CAPS  Take 1 capsule by mouth 2 (two) times daily.           .  Phenazopyridine HCl (AZO TABS PO)  Take by mouth. As directed, for bladder          .  Polyethyl Glycol-Propyl Glycol (SYSTANE OP)  Apply to eye. As directed          .  PROAIR HFA 108 (90 BASE) MCG/ACT inhaler           .  Psyllium 28 % POWD  Take one teaspoon once a day          .  balsalazide (COLAZAL) 750 MG capsule  Take three tablets by mouth twice a day   180 capsule   1   .  HYDROcodone-acetaminophen (NORCO) 10-325 MG per tablet  Take 1 tablet by mouth as needed.           .  mesalamine (CANASA) 1000 MG suppository  Place 1 suppository (1,000 mg total) rectally at bedtime.   30 suppository   6   .  mesalamine (LIALDA) 1.2 G EC tablet  Take 2 tablets by mouth daily with breakfast   60 tablet   2       Facility-Administered Medications Prior to Visit     Medication  Dose  Route  Frequency  Provider  Last Rate  Last Dose   .  0.9 %  sodium chloride infusion   500 mL  Intravenous  Continuous  Mardella Layman, MD          Past Medical History   Diagnosis  Date   .  Spinal stenosis, unspecified region other than cervical     .  Urinary tract infection, site not specified     .  Unspecified hypothyroidism     .  Other and unspecified hyperlipidemia     .  Arthropathy, unspecified, site unspecified     .  Esophageal reflux     .  Hiatal hernia     .  Coronary atherosclerosis of unspecified type of vessel, native or graft     .  Peripheral vascular disease, unspecified     .  Unspecified asthma     .  Diverticulosis of colon (without mention of hemorrhage)     .  Crohn's     .  PVC's (premature ventricular contractions)     .  Hypertension     .  Zollinger-Ellison syndrome     .  Hearing loss     .  Murmur     .  Sinus trouble      Past Surgical History   Procedure  Date   .  Appendectomy  1955   .  Carpal tunnel release  2009       right   .  Knee arthroscopy  1994       right   .  Femoral artery - popliteal artery bypass graft  1992   .  Angioplasty  02/1997       X 3 stents   .  Stapedectomy  1976       left   .  Dilation and curettage of uterus     .  Cervical polypectomy     .  Leg surgery         left   .  Cataract extraction     .  Colonoscopy     .  Polypectomy         Social History   .  Marital Status:  Widowed       Spouse Name:  N/A       Number of Children:  N/A   .  Years of Education:  N/A       Occupational History   .  retired      Social History Main Topics   .  Smoking status:  Never Smoker    .  Smokeless tobacco:  Never Used   .  Alcohol Use:  No   .  Drug Use:  No   .  Sexually Active:  None    Other Topics  Concern   .  None       Social History Narrative   .  None    Family History   Problem  Relation  Age of Onset   .  Colon cancer  Sister     .  Colon cancer  Father     .   Diabetes  Daughter     .  Heart disease  Mother     .  Diabetes  Brother         X2   .  Heart disease  Brother         X2    Physical Exam: Elderly appearing patient in no acute distress. Vital signs are stable. I cannot appreciate stigmata of chronic liver disease. She has kyphoscoliosis. Chest exam shows rhonchi in both lung fields. She appear to be in a regular rhythm with a soft systolic ejection murmur. Her abdomen shows no definite organomegaly, masses or tenderness. Bowel sounds are normal. Rectal exam shows soft liquid stool which is not bloody but is markedly guaiac positive. Mental status is normal.   Assessment and Plan: Possible flare of chronic left-sided inflammatory bowel disease, rule out C. difficile infection, ischemic colitis, or infectious colitis. She needs hospitalization for fluid and electrolyte correction, oxygen administration, stool exams, and perhaps followup flexible sigmoidoscopy. 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 She also may need pulmonary consultation per her COPD and current respiratory insufficiency. No diagnosis found.    Electronic signature on 08/05/2011     Discontinued Medications     Reason for Discontinue  mesalamine (LIALDA) 1.2 G EC tablet Therapy completed

## 2011-08-06 DIAGNOSIS — K529 Noninfective gastroenteritis and colitis, unspecified: Secondary | ICD-10-CM | POA: Diagnosis present

## 2011-08-06 DIAGNOSIS — K5289 Other specified noninfective gastroenteritis and colitis: Secondary | ICD-10-CM

## 2011-08-06 LAB — CBC
MCH: 23.2 pg — ABNORMAL LOW (ref 26.0–34.0)
MCV: 73.4 fL — ABNORMAL LOW (ref 78.0–100.0)
Platelets: 351 10*3/uL (ref 150–400)
RBC: 5.12 MIL/uL — ABNORMAL HIGH (ref 3.87–5.11)
RDW: 17.2 % — ABNORMAL HIGH (ref 11.5–15.5)

## 2011-08-06 MED ORDER — VANCOMYCIN 50 MG/ML ORAL SOLUTION
250.0000 mg | Freq: Four times a day (QID) | ORAL | Status: DC
  Administered 2011-08-06 – 2011-08-10 (×18): 250 mg via ORAL
  Filled 2011-08-06 (×22): qty 5

## 2011-08-06 MED ORDER — SACCHAROMYCES BOULARDII 250 MG PO CAPS
250.0000 mg | ORAL_CAPSULE | Freq: Two times a day (BID) | ORAL | Status: DC
  Administered 2011-08-06 – 2011-08-10 (×9): 250 mg via ORAL
  Filled 2011-08-06 (×10): qty 1

## 2011-08-06 MED ORDER — MESALAMINE 400 MG PO TBEC
800.0000 mg | DELAYED_RELEASE_TABLET | Freq: Three times a day (TID) | ORAL | Status: DC
  Administered 2011-08-06 – 2011-08-10 (×14): 800 mg via ORAL
  Filled 2011-08-06 (×15): qty 2

## 2011-08-06 MED ORDER — PANTOPRAZOLE SODIUM 40 MG PO TBEC
40.0000 mg | DELAYED_RELEASE_TABLET | Freq: Every day | ORAL | Status: DC
  Administered 2011-08-07 – 2011-08-10 (×4): 40 mg via ORAL
  Filled 2011-08-06 (×4): qty 1

## 2011-08-06 NOTE — Progress Notes (Signed)
[facility] Progress Note  Subjective: *Passed only one poorly formed stool today. Denies abdominal pain.**  Objective:  Vital signs in last 24 hours: Temp:  [97.8 F (36.6 C)-98.7 F (37.1 C)] 98.3 F (36.8 C) (05/22 0502) Pulse Rate:  [75-83] 77  (05/22 0502) Resp:  [18-20] 18  (05/22 0502) BP: (110-148)/(45-64) 148/64 mmHg (05/22 0502) SpO2:  [94 %-98 %] 94 % (05/22 0758) Weight:  [125 lb 7.1 oz (56.9 kg)] 125 lb 7.1 oz (56.9 kg) (05/21 2028) Last BM Date: 08/05/11 General:   Alert,  Well-developed,  white female in NAD Heart:  Regular rate and rhythm; no murmurs Abdomen:  Soft, nontender and nondistended. Normal bowel sounds, without guarding, and without rebound.   Extremities:  Without edema. Neurologic:  Alert and  oriented x4;  grossly normal neurologically. Psych:  Alert and cooperative. Normal mood and affect.  Intake/Output from previous day: 05/21 0701 - 05/22 0700 In: 206.3 [I.V.:206.3] Out: 302 [Urine:300; Stool:2] Intake/Output this shift:    Lab Results:  Basename 08/06/11 0520 08/05/11 1604  WBC 8.4 9.0  HGB 11.9* 12.6  HCT 37.6 39.2  PLT 351 377   BMET  Basename 08/05/11 1604  NA 130*  K 4.1  CL 96  CO2 24  GLUCOSE 156*  BUN 11  CREATININE 0.62  CALCIUM 8.8   LFT  Basename 08/05/11 1604  PROT 6.6  ALBUMIN 3.4*  AST 17  ALT 17  ALKPHOS 56  BILITOT 0.2*  BILIDIR --  IBILI --   PT/INR No results found for this basename: LABPROT:2,INR:2 in the last 72 hours Hepatitis Panel No results found for this basename: HEPBSAG,HCVAB,HEPAIGM,HEPBIGM in the last 72 hours  Studies/Results: Dg Chest 2 View  08/05/2011  *RADIOLOGY REPORT*  Clinical Data: Shortness of breath.  CHEST - 2 VIEW  Comparison: 04/17/2008  Findings: Cardiomegaly.  Vascular congestion.  Large hiatal hernia. Bibasilar opacities, most likely atelectasis.  Small bilateral pleural effusions.  IMPRESSION: Cardiomegaly with vascular congestion.  Bibasilar atelectasis.  Small effusions.  Large hiatal hernia.  Original Report Authenticated By: Cyndie Chime, M.D.     Assessment / Plan: *Colitis-improved. I suspect this is a segmental colitis. Stool cultures are pending.  Recommendations #1 continue antibiotics pending stool cultures and continue**prednisone Active Problems:  * No active hospital problems. *      LOS: 1 day   Melvia Heaps  08/06/2011, 10:20 AM

## 2011-08-06 NOTE — Progress Notes (Signed)
UR completed 

## 2011-08-06 NOTE — Progress Notes (Signed)
Patient ID: [name], female   DOB: 02/03/1916, 78 y.o.   MRN: [id number]    Cdiff has returned positive- will leave on Iv flagyl for now, add po vancomycin 250 mg  4 x daily, and florastor twice daily Advance diet to full liquids Will stop prednisone as she has only been on it for a few days, and restart mesalamine - she is on colazal at home - not on formulary so use Asacol here(her insurance will not pay for it so will need to be sure she is on Colazal at discharge)  CXR shows mild vascular congestion- she is asymptomatic- daughter says she has not been on lasix at home for quite a while- will hold ,decrease IV fluids and observe.   Discussed Cdiff Dx with pt and daughter.

## 2011-08-07 DIAGNOSIS — A0472 Enterocolitis due to Clostridium difficile, not specified as recurrent: Secondary | ICD-10-CM

## 2011-08-07 NOTE — Progress Notes (Signed)
[facility] Progress Note  SUBJECTIVE: still having frequent diarrhea but no significant abdominal pain  OBJECTIVE:  Vital signs in last 24 hours: Temp:  [97.5 F (36.4 C)-97.9 F (36.6 C)] 97.5 F (36.4 C) (05/23 0625) Pulse Rate:  [68-78] 68  (05/23 0625) Resp:  [18-19] 18  (05/23 0625) BP: (123-135)/(54-66) 135/54 mmHg (05/23 0625) SpO2:  [93 %-98 %] 93 % (05/23 0746) Last BM Date: 08/07/11 General:    white female in NAD Heart:  Regular rate and rhythm; murmur present Lungs: Respirations even and unlabored, no wheezes Abdomen:  Soft, nondistended, mild right mid abdominal tenderness. Normal bowel sounds. Extremities:  Without edema. Neurologic:  Alert and oriented,  grossly normal neurologically. Psych:  Cooperative. Normal mood and affect.   Lab Results:  Basename 08/06/11 0520 08/05/11 1604  WBC 8.4 9.0  HGB 11.9* 12.6  HCT 37.6 39.2  PLT 351 377   BMET  Basename 08/05/11 1604  NA 130*  K 4.1  CL 96  CO2 24  GLUCOSE 156*  BUN 11  CREATININE 0.62  CALCIUM 8.8   LFT  Basename 08/05/11 1604  PROT 6.6  ALBUMIN 3.4*  AST 17  ALT 17  ALKPHOS 56  BILITOT 0.2*  BILIDIR --  IBILI --    Studies/Results: Dg Chest 2 View  08/05/2011  *RADIOLOGY REPORT*  Clinical Data: Shortness of breath.  CHEST - 2 VIEW  Comparison: 04/17/2008  Findings: Cardiomegaly.  Vascular congestion.  Large hiatal hernia. Bibasilar opacities, most likely atelectasis.  Small bilateral pleural effusions.  IMPRESSION: Cardiomegaly with vascular congestion.  Bibasilar atelectasis. Small effusions.  Large hiatal hernia.  Original Report Authenticated By: Cyndie Chime, M.D.    ASSESSMENT / PLAN:  1. C-Diff, continues to have diarrhea. WBC is normal. Abdominal exam not overly concerning. Continue IV Flagyl,oral Vancomycin and Florastor. Continue Asacol in house but switch back to Colazal upon discharge as insurance will not pay for Asacol.   2. History of left-sided colitis,  on Mesalamine at home.     3. Hyperglycemia, probably secondary to steroids which have now been stopped.   4. Shortness of breath, dyspnea. CXR on admit revealed vascular congestion. Shortness of breath has resolved with oxygen. Will leave her IVF at 50ml /hr given ongoing diarrhea. Continue oxygen per [place].   5. Zollinger-Ellison, continue PPI  6. HTN, stable on anti-hypertensives   LOS: 2 days   Willette Cluster  08/07/2011, 9:12 AM

## 2011-08-07 NOTE — Progress Notes (Signed)
I have personally taken an interval history, reviewed the chart, and examined the patient.  I agree with the extender's note, impression and recommendations.  

## 2011-08-08 MED ORDER — NYSTATIN 100000 UNIT/ML MT SUSP
5.0000 mL | Freq: Four times a day (QID) | OROMUCOSAL | Status: DC
  Administered 2011-08-08 – 2011-08-10 (×10): 500000 [IU] via ORAL
  Filled 2011-08-08 (×12): qty 5

## 2011-08-08 MED ORDER — NYSTATIN NICU ORAL SYRINGE 100,000 UNITS/ML: 1.0000 mL | Freq: Four times a day (QID) | OROMUCOSAL | Status: DC

## 2011-08-08 NOTE — Progress Notes (Signed)
[facility] Progress Note  SUBJECTIVE: No abdominal pain. Diarrhea improving. Tongue a little sore  OBJECTIVE:  Vital signs in last 24 hours: Temp:  [97.5 F (36.4 C)-97.6 F (36.4 C)] 97.5 F (36.4 C) (05/24 0630) Pulse Rate:  [68-77] 68  (05/24 0630) Resp:  [18] 18  (05/24 0630) BP: (132-133)/(56-64) 133/62 mmHg (05/24 0630) SpO2:  [94 %-96 %] 95 % (05/24 0630) Last BM Date: 08/07/11 General:    Pleasant white female in NAD Mouth: dry red tongue. No obvious candida Heart:  Regular rate and rhythm Lungs: Respirations even and unlabored, lungs CTA bilaterally Abdomen:  Soft, nondistended, mild RUQ and RLQ tenderness.. Normal bowel sounds. Extremities:  Without edema. Neurologic:  Alert and oriented,  grossly normal neurologically. Psych:  Cooperative. Normal mood and affect.   Lab Results:  Basename 08/06/11 0520 08/05/11 1604  WBC 8.4 9.0  HGB 11.9* 12.6  HCT 37.6 39.2  PLT 351 377   BMET  Basename 08/05/11 1604  NA 130*  K 4.1  CL 96  CO2 24  GLUCOSE 156*  BUN 11  CREATININE 0.62  CALCIUM 8.8   LFT  Basename 08/05/11 1604  PROT 6.6  ALBUMIN 3.4*  AST 17  ALT 17  ALKPHOS 56  BILITOT 0.2*  BILIDIR --  IBILI --    ASSESSMENT / PLAN:  1. C-Diff colitis, improving. Continue Flagyl, Vanco, Florastor Tolerating fulls, will advance to low residue solids. .  2.  Sore tongue. No obvious candida but did have steroids and also on antibiotics. Start magic mouthwash.  3. Hyponatremia, Na+ 130 on 5/21. Will recheck.   4. History of left-sided colitis, stable on Asacol.  5. Earna Coder, continue PPI  6. Risk for deconditioning, on 3rd day of admission. Will ask PT/OT to eval and treat to avoid deconditiong.    LOS: 3 days   Willette Cluster  08/08/2011, 9:14 AM

## 2011-08-08 NOTE — Progress Notes (Signed)
Diarrhea is improved. Overall feels better.  Plan to advance diet. Anticipate discharge next 1-2 days.

## 2011-08-08 NOTE — Progress Notes (Signed)
Patient eating dinner without complaint.

## 2011-08-09 LAB — STOOL CULTURE

## 2011-08-09 NOTE — Progress Notes (Signed)
Subjective: cross cover for [facility]: [name] is doing very well today. Had a session with the physical therapist. Had 2 soft BM's today. Denies any abdominal pain, nausea or vomiting. She is tolerating her diet well. Her daughter wants her to be here for another day or two as she has not been able to walk by herself and feels she is extremely deconditioned since admission. The patient herself, wants to go home.   Objective: Vital signs in last 24 hours: Temp:  [98 F (36.7 C)-98.9 F (37.2 C)] 98 F (36.7 C) (05/25 1428) Pulse Rate:  [88-98] 88  (05/25 1428) Resp:  [16-18] 18  (05/25 1428) BP: (134-145)/(52-60) 136/52 mmHg (05/25 1428) SpO2:  [93 %-94 %] 94 % (05/25 1428) Last BM Date: 08/09/11  Intake/Output from previous day: 05/24 0701 - 05/25 0700 In: 1805 [P.O.:480; I.V.:1225; IV Piggyback:100] Out: -  Intake/Output this shift: Total I/O In: 480 [P.O.:480] Out: -   General appearance: alert, cooperative, appears stated age, no distress, moderately obese and pale Resp: clear to auscultation bilaterally Cardio: regular rate and rhythm, S1, S2 normal, no murmur, click, rub or gallop GI: soft, non-tender; bowel sounds normal; no masses,  no organomegaly Extremities: extremities normal, atraumatic, no cyanosis or edema  Lab Results: No results found for this basename: WBC:3,HGB:3,HCT:3,PLT:3 in the last 72 hours BMET No results found for this basename: NA:3,K:3,CL:3,CO2:3,GLUCOSE:3,BUN:3,CREATININE:3,CALCIUM:3 in the last 72 hours LFT No results found for this basename: PROT,ALBUMIN,AST,ALT,ALKPHOS,BILITOT,BILIDIR,IBILI in the last 72 hours PT/INR No results found for this basename: LABPROT:2,INR:2 in the last 72 hours Hepatitis Panel No results found for this basename: HEPBSAG,HCVAB,HEPAIGM,HEPBIGM in the last 72 hours C-Diff No results found for this basename: CDIFFTOX:3 in the last 72 hours Fecal Lactopherrin No results found for this basename: FECLLACTOFRN in the last 72  hours  Studies/Results: No results found.  Medications: I have reviewed the patient's current medications.  Assessment/Plan: 1) IBD complicated by PMC-significantly improved. Continue present care. Will hold off on discharge for another 2 days. 2) GERD/Hiatal hernia-on Protonix. 3) Colonic diverticulosis. 4) HTN/Hyperlipidemia.  5) CAD/RBBB  LOS: 4 days   [name] 08/09/2011, 3:46 PM

## 2011-08-09 NOTE — Evaluation (Signed)
Physical Therapy Evaluation Patient Details Name: [name] MRN: [id number] DOB: Jul 17, 1915 Today's Date: 08/09/2011 Time: 7829-5621 PT Time Calculation (min): 30 min  PT Assessment / Plan / Recommendation Clinical Impression  78 yo female who has had several hospitalizations in recent months admitted to [facility] for diarrhea.  She has some deconditioning and needs assist for transfers. We used a sit to stand lift today (Steady) and daughter may be interested in finding one for use at home. Pt will need HHPT and aide if insurance covers.( Daughter and family are considering doing an internet search to find a sit to stand lift as they may not be available locally)    PT Assessment  Patient needs continued PT services    Follow Up Recommendations  Home health PT (home health aide)    Barriers to Discharge        lEquipment Recommendations   (family to research a sit to stand lift)    Recommendations for Other Services     Frequency Min 3X/week    Precautions / Restrictions Precautions Precautions: Fall Restrictions Weight Bearing Restrictions: No   Pertinent Vitals/Pain Pt with no c/o pain, but still is having frequent diarrhea      Mobility  Bed Mobility Bed Mobility: Rolling Right;Rolling Left;Supine to Sit;Sit to Supine Rolling Right: 4: Min assist Rolling Left: 4: Min assist Supine to Sit: 3: Mod assist Sit to Supine: 3: Mod assist Details for Bed Mobility Assistance: pt needs assist to move legs and to raise upper body up Transfers Transfers: Sit to Stand;Stand to Sit Sit to Stand: 3: Mod assist Stand to Sit: 3: Mod assist Transfer via Lift Equipment: Stedy Details for Transfer Assistance: pt is able to stand with assist, but she is unable to move feet to transfer Ambulation/Gait Ambulation/Gait Assistance: Not tested (comment) Ambulation/Gait Assistance Details: pt is unable to ambulate Stairs: No Wheelchair Mobility Wheelchair Mobility: No    Exercises      PT Diagnosis: Generalized weakness  PT Problem List: Decreased strength;Decreased mobility;Decreased activity tolerance PT Treatment Interventions: Therapeutic activities;Therapeutic exercise;Functional mobility training;Patient/family education   PT Goals Acute Rehab PT Goals PT Goal Formulation: With patient/family Time For Goal Achievement: 08/23/11 Potential to Achieve Goals: Fair Pt will Roll Supine to Right Side: with supervision PT Goal: Rolling Supine to Right Side - Progress: Goal set today Pt will Roll Supine to Left Side: with supervision PT Goal: Rolling Supine to Left Side - Progress: Goal set today Pt will go Supine/Side to Sit: with supervision PT Goal: Supine/Side to Sit - Progress: Goal set today Pt will go Sit to Stand: with min assist PT Goal: Sit to Stand - Progress: Goal set today  Visit Information  Last PT Received On: 08/09/11 Assistance Needed: +1    Subjective Data  Subjective: daughter reports they usually have a hard time at home at first Patient Stated Goal: to go home   Prior Functioning  Home Living Lives With: Daughter Available Help at Discharge: Family Type of Home: House Home Access: Ramped entrance Home Layout: One level Home Adaptive Equipment: Art gallery manager;Bedside commode/3-in-1;Walker - rolling;Walker - standard Prior Function Level of Independence: Needs assistance Needs Assistance: Transfers Transfer Assistance: pt has difficulty moving feet to transfer Able to Take Stairs?: No Communication Communication: No difficulties    Cognition  Overall Cognitive Status: Appears within functional limits for tasks assessed/performed Arousal/Alertness: Awake/alert Orientation Level: Appears intact for tasks assessed Behavior During Session: [facility] for tasks performed    Extremity/Trunk Assessment Right Lower  Extremity Assessment RLE ROM/Strength/Tone: Deficits RLE ROM/Strength/Tone Deficits: generalized muscle atrophy RLE Sensation:  Deficits RLE Sensation Deficits: decreasea RLE Coordination: Deficits Left Lower Extremity Assessment LLE ROM/Strength/Tone: Deficits LLE ROM/Strength/Tone Deficits: generalized atrophy LLE Sensation: Deficits LLE Sensation Deficits: numbness LLE Coordination: Deficits Trunk Assessment Trunk Assessment: Kyphotic   Balance Balance Balance Assessed: Yes Static Sitting Balance Static Sitting - Balance Support: No upper extremity supported Static Sitting - Level of Assistance: 5: Stand by assistance Static Sitting - Comment/# of Minutes: 10  End of Session PT - End of Session Activity Tolerance: Patient limited by fatigue Patient left: in chair;with family/visitor present Nurse Communication: Need for lift equipment   Donnetta Hail 08/09/2011, 4:37 PM

## 2011-08-09 NOTE — Plan of Care (Signed)
Problem: Phase I Progression Outcomes Goal: Discharge plan established Outcome: Progressing Home with daughter     

## 2011-08-10 MED ORDER — METRONIDAZOLE 500 MG PO TABS: 500.0000 mg | ORAL_TABLET | Freq: Three times a day (TID) | ORAL | Status: AC

## 2011-08-10 MED ORDER — VANCOMYCIN 50 MG/ML ORAL SOLUTION: 250.0000 mg | Freq: Four times a day (QID) | ORAL | Status: DC

## 2011-08-10 MED ORDER — SACCHAROMYCES BOULARDII 250 MG PO CAPS: 250.0000 mg | ORAL_CAPSULE | Freq: Two times a day (BID) | ORAL | Status: AC

## 2011-08-10 NOTE — Progress Notes (Signed)
Patient has orders to ambulate but per patient she is unable to ambulate, uses wheel chair at home and she is able to pull up and stand to perform certain tasks. Patient is using stedy .

## 2011-08-10 NOTE — Progress Notes (Signed)
DC instructions gone over with patient and daughter, both verbalized understanding.  Transported via wheelchair to front of hospital accompanied by tech and daughter.

## 2011-08-10 NOTE — Discharge Summary (Signed)
Physician Discharge Summary  Patient ID: [name] MRN: [id number] DOB/AGE: 1915/05/29 78 y.o.  Admit date: 08/05/2011 Discharge date: 08/10/2011  Admission Diagnoses: C. Difficle colitis superimposed on IBD.  Discharge Diagnoses:  Principal Problem:  *Pseudomembranous colitis Active Problems: Crohn's colitis GERD Hiatal hernia Diverticulosis Hyperlipidemia Hypertension Recurrent UTI's PVD Asthma CAD RBBB PVC's Arthritis Spinal stenosis COPD  Discharged Condition: Fairly good  Hospital Course: Patient has done well since admission and been on multiple antibiotics for C. Difficle colitis. Diarrhea has resolved. She was seen and evaluated by the PT team.  Consults: None  Significant Diagnostic Studies: Stool studies for C. DIFF.  Treatments: IV hydration, antibiotics: metronidazole and respiratory therapy:   Discharge Exam: Blood pressure 130/62, pulse 84, temperature 98.4 F (36.9 C), temperature source Oral, resp. rate 18, height 5\' 3"  (1.6 m), weight 56.9 kg (125 lb 7.1 oz), SpO2 94.00%. General appearance: cooperative, appears stated age, no distress and mildly obese Head: atraumatic Neck: no JVD, supple, symmetrical Resp: clear to auscultation bilaterally Chest wall: no tenderness GI: soft, non-tender; bowel sounds normal; no masses,  no organomegaly Extremities: extremities normal, atraumatic, no cyanosis or edema  Disposition: Discharge home to the care of her daughters.    Medication List  As of 08/10/2011  3:31 PM   ASK your doctor about these medications         ADVAIR DISKUS 100-50 MCG/DOSE Aepb   Generic drug: Fluticasone-Salmeterol   Inhale 1 puff into the lungs every 12 (twelve) hours.      albuterol 108 (90 BASE) MCG/ACT inhaler   Commonly known as: PROVENTIL HFA;VENTOLIN HFA   Inhale 2 puffs into the lungs 4 (four) times daily.      amLODipine 5 MG tablet   Commonly known as: NORVASC   Take 5 mg by mouth daily.      aspirin 325 MG  tablet   Take 325 mg by mouth daily.      AZO TABS PO   Take by mouth. As directed, for bladder      AZO-CRANBERRY PO   Take 2 capsules by mouth daily. As needed      balsalazide 750 MG capsule   Commonly known as: COLAZAL   Take three tablets by mouth twice a day      celecoxib 100 MG capsule   Commonly known as: CELEBREX   Take 100 mg by mouth daily. As needed pain      esomeprazole 40 MG capsule   Commonly known as: NEXIUM   Take 40 mg by mouth 2 (two) times daily.      Ferrous Fum-Iron Polysacch-FA 162-115.2-1 MG Caps   Take 1 capsule by mouth daily.      Fish Oil 1000 MG Caps   Take 1 capsule by mouth 2 (two) times daily.      gabapentin 300 MG capsule   Commonly known as: NEURONTIN   Take one tablet in the morning and take two tablets at bedtime.      HYDROcodone-acetaminophen 10-325 MG per tablet   Commonly known as: NORCO   Take 1 tablet by mouth as needed.      hyoscyamine 0.125 MG Tbdp   Commonly known as: ANASPAZ   Place 0.125 mg under the tongue. Three as needed once daily      levothyroxine 50 MCG tablet   Commonly known as: SYNTHROID, LEVOTHROID   Take 50 mcg by mouth daily.      mesalamine 1000 MG suppository   Commonly known as:  CANASA   Place 1 suppository (1,000 mg total) rectally at bedtime.      metoprolol tartrate 25 MG tablet   Commonly known as: LOPRESSOR   Take 25 mg by mouth 2 (two) times daily.      multivitamin capsule   Take 1 capsule by mouth daily.      NASONEX 50 MCG/ACT nasal spray   Generic drug: mometasone   Place 2 sprays into the nose daily.      ondansetron 4 MG tablet   Commonly known as: ZOFRAN   Take 4 mg by mouth every 8 (eight) hours as needed.      oxyCODONE-acetaminophen 5-325 MG per tablet   Commonly known as: PERCOCET   Take 1-2 tablets by mouth every 4-6 hours      potassium chloride SA 20 MEQ tablet   Commonly known as: K-DUR,KLOR-CON   Take 20 mEq by mouth daily.      predniSONE 10 MG tablet    Commonly known as: DELTASONE   Take 40 mg by mouth daily. Patient has been splitting dose into two at breakfast two at lunch      Psyllium 28 % Powd   Take one teaspoon once a day      SYSTANE OP   Place 2 drops into both eyes at bedtime. As directed             Signed: Airiana Elman 08/10/2011, 3:31 PM

## 2011-08-10 NOTE — Progress Notes (Signed)
PT Cancellation Note  Treatment cancelled today due to patient's refusal to participate.  Pt and daughter state that she has just gotten back to bed and does not want to get back up.  Asked daughter if there were any questions or concerns about the steady lift equipment or D/C and daughter stated no, but that she was still looking into purchasing the lift. Pt to possibly D/C today, however if she doesn't, will keep on caseload.    Thanks,   Page, Meribeth Mattes 08/10/2011, 11:54 AM [phone]

## 2011-08-11 NOTE — Progress Notes (Signed)
Discharge summary sent to payer through [facility]  

## 2011-08-12 ENCOUNTER — Telehealth: Payer: Self-pay | Admitting: Gastroenterology

## 2011-08-12 DIAGNOSIS — K529 Noninfective gastroenteritis and colitis, unspecified: Secondary | ICD-10-CM

## 2011-08-12 DIAGNOSIS — A0472 Enterocolitis due to Clostridium difficile, not specified as recurrent: Secondary | ICD-10-CM

## 2011-08-12 NOTE — Telephone Encounter (Signed)
Pt's daughter Scarlette Calico called to ask Dr [name] if he would refer pt to PT via [employer] with an aide. HH was not ordered on discharge planning . Called [facility] and spoke with Jodi Mourning, with no help since pt is already home. Dr [name] agrred to refer pt. Spoke with Okey Regal at [facility], [phone] and faxed her orders and info to [phone]. Scarlette Calico notified.

## 2011-08-12 NOTE — Telephone Encounter (Signed)
rx called to [facility] and all pts information faxed to [facility] frances aware. I will call [facility] tomorrow to check status.

## 2011-08-13 NOTE — Telephone Encounter (Signed)
They picked up vanco yesterday they will call back when they are able to move pt for an office visit

## 2011-08-14 ENCOUNTER — Telehealth: Payer: Self-pay | Admitting: Gastroenterology

## 2011-08-14 NOTE — Telephone Encounter (Signed)
Informed [name], that dizziness could be coming from Vanc because Hypotension is a SE. Have pt increase her fluids and rise up slowly and stabilize before she moves. If no better call us back after these interventions. She does report the diarrhea is much better.

## 2011-08-27 ENCOUNTER — Telehealth: Payer: Self-pay | Admitting: *Deleted

## 2011-08-27 NOTE — Telephone Encounter (Signed)
[name] left a VM about pt stating she was changing pt's orders and we might get a fax for Dr [name] to sign. Pt is unable to transfer and weight bear with her daughter and she is switching to a slide board so maybe he daughter can help. FYI

## 2011-08-31 NOTE — Telephone Encounter (Signed)
Her 1 care MD should approve.Marland KitchenMarland Kitchen

## 2011-09-01 ENCOUNTER — Telehealth: Payer: Self-pay | Admitting: Gastroenterology

## 2011-09-01 NOTE — Telephone Encounter (Signed)
Stop flagyl,taper vanco,125 mg bid for 2 weeks,then daily for 2 weeks,then qod for 2 weeks

## 2011-09-01 NOTE — Telephone Encounter (Signed)
Pt's daughter , Scarlette Calico called and reports pt has been on Vanc and Flagyl since 08/10/11; VANC 250mg  qid and FLAGYL 500mg  tid. Pt remains weak and has a poor appetite; she has home health PT and nursing visits, but still can't ambulate like before. Pt usually has 2 soft stools daily and has no diarrhea. Can pt decrease or taper off the meds? I discussed with daughter that pt may never be as strong as she was before and she stated understanding.

## 2011-09-01 NOTE — Telephone Encounter (Signed)
Informed [name] of Dr Norval Gable tapering orders for Vanc; she stated understanding.

## 2011-09-04 ENCOUNTER — Telehealth: Payer: Self-pay | Admitting: Gastroenterology

## 2011-09-04 ENCOUNTER — Other Ambulatory Visit: Payer: Self-pay | Admitting: *Deleted

## 2011-09-04 MED ORDER — VANCOMYCIN 50 MG/ML ORAL SOLUTION: 125.0000 mg | ORAL | Status: DC

## 2011-09-05 NOTE — Telephone Encounter (Signed)
Got approval med; clarification code 08 needed to be added. Spoke with Gigi Gin, a daughter to inform her med will not be mixed until tomorrow and [facility] will contact them.

## 2011-09-05 NOTE — Telephone Encounter (Signed)
Thank you for helping me with this pt.

## 2011-09-05 NOTE — Telephone Encounter (Signed)
Spoke with [employer] at pharmacy and hopefully we will hear something today. Case # [id number] for 1000mg  injectable vial ordered for compounding; [phone]; verified with [facility].

## 2011-09-08 ENCOUNTER — Telehealth: Payer: Self-pay | Admitting: Gastroenterology

## 2011-09-08 NOTE — Telephone Encounter (Signed)
lmom for [name] to call back. Dr [name] did agree that pt may have HH for 2 more weeks.

## 2011-09-09 NOTE — Telephone Encounter (Signed)
Lucendia Herrlich never called back and I tried to reach her at the [facility] ofc, [phone]. Someone at that ofc took the order for 2 more weeks of therapy for pt.

## 2011-09-30 ENCOUNTER — Other Ambulatory Visit: Payer: Self-pay | Admitting: Gastroenterology

## 2011-10-31 ENCOUNTER — Other Ambulatory Visit: Payer: Self-pay | Admitting: Gastroenterology

## 2011-11-11 ENCOUNTER — Ambulatory Visit (INDEPENDENT_AMBULATORY_CARE_PROVIDER_SITE_OTHER): Payer: Medicare Other | Admitting: Gastroenterology

## 2011-11-11 ENCOUNTER — Encounter: Payer: Self-pay | Admitting: Gastroenterology

## 2011-11-11 VITALS — BP 120/64 | HR 88

## 2011-11-11 DIAGNOSIS — K5289 Other specified noninfective gastroenteritis and colitis: Secondary | ICD-10-CM

## 2011-11-11 DIAGNOSIS — Z8619 Personal history of other infectious and parasitic diseases: Secondary | ICD-10-CM

## 2011-11-11 DIAGNOSIS — K219 Gastro-esophageal reflux disease without esophagitis: Secondary | ICD-10-CM

## 2011-11-11 DIAGNOSIS — E164 Increased secretion of gastrin: Secondary | ICD-10-CM

## 2011-11-11 DIAGNOSIS — K523 Indeterminate colitis: Secondary | ICD-10-CM

## 2011-11-11 NOTE — Progress Notes (Signed)
This is a very, very nice 78 year old Caucasian female with chronic left-sided colitis managed with oral amino salicylates. She also has chronic GERD and takes Nexium 40 mg twice a day, and currently denies acid reflux symptoms or dysphagia. She was hospitalized several months ago with C. difficile infection, and was on a prolonged tapering course of metronidazole. She currently denies lower nominal pain, diarrhea, rectal bleeding, or systemic complaints. She is cared for by her daughter who is with her for her interview and exam today. Patient has chronic peripheral neuropathy is on Neurontin and hydrocodone, if she becomes constipated she uses when necessary MiraLax. Since she was discharged several months ago she has been on Z-Pak for chronic bronchitis, and had no resultant GI issues. Her appetite is returning, and she is currently gaining weight. She is on B12 nasal spray because of B12 deficiency, aspirin 325 mg a day, and inhalers for COPD. She is no longer on topical aminosalicylate sore iron therapy.  Current Medications, Allergies, Past Medical History, Past Surgical History, Family History and Social History were reviewed in [employer] record.  Pertinent Review of Systems Negative   Physical Exam: Healthy-appearing patient in no acute distress who is wheelchair dependent. Her blood pressure today is 120/64 and pulse is 88 and regular. Her chest shows scattered rhonchi but no areas of consolidation. She has a soft systolic flow murmur but no S3 gallop and appears to be in a regular rhythm. Her abdomen is difficult to examine but is certainly not distended, there is no organomegaly, masses, tenderness, and bowel sounds are normal. Rectal exam is deferred. Mental status is entirely normal.    Assessment and Plan: Elderly patient currently doing well after being treated for C. difficile superinfection on top of her chronic left-sided colitis. She currently seems to be in  remission on oral Colazal 750 mg 3 tablets twice a day, Nexium 40 mg twice a day, and when necessary MiraLax. I had a long discussion with the patient and her daughter concerning  C. difficile and its management. There is data suggesting that if she does resume antibiotics that she take Florstar twice a day during that time. Hopefully she has eradicated C. difficile from her gut environment, and I am sure that she is living in a clean environment with her daughter who uses common sense home care techniques. The patient has required twice a day PPI therapy for her GERD, and I would continue this recurrent levels with when necessary GI followup as needed. Please copy Dr. Lois Huxley in [place]. 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 She has not had an extensive tumor evaluation because of her age and other medical conditions. CT scan of the abdomen in May of this year otherwise was unremarkable except for diverticulosis. No diagnosis found.

## 2012-02-02 ENCOUNTER — Other Ambulatory Visit: Payer: Self-pay | Admitting: Gastroenterology

## 2012-04-01 ENCOUNTER — Other Ambulatory Visit: Payer: Self-pay | Admitting: Gastroenterology

## 2012-05-03 ENCOUNTER — Telehealth: Payer: Self-pay | Admitting: *Deleted

## 2012-05-03 MED ORDER — BALSALAZIDE DISODIUM 750 MG PO CAPS: ORAL_CAPSULE | ORAL | Status: DC

## 2012-05-03 NOTE — Telephone Encounter (Signed)
CALLED PATIENT TO SEE IF SHE IS STILL TAKING COLAZAL AND PATIENT SAID THAT SHE IS.  I SENT REFILL TO [facility] PER PATIENT REQUEST

## 2012-06-28 ENCOUNTER — Other Ambulatory Visit: Payer: Self-pay | Admitting: Gastroenterology

## 2012-07-28 ENCOUNTER — Other Ambulatory Visit: Payer: Self-pay | Admitting: Gastroenterology

## 2012-08-19 ENCOUNTER — Telehealth: Payer: Self-pay | Admitting: Gastroenterology

## 2012-08-19 ENCOUNTER — Other Ambulatory Visit (INDEPENDENT_AMBULATORY_CARE_PROVIDER_SITE_OTHER): Payer: Medicare Other

## 2012-08-19 ENCOUNTER — Encounter: Payer: Self-pay | Admitting: Physician Assistant

## 2012-08-19 ENCOUNTER — Ambulatory Visit (INDEPENDENT_AMBULATORY_CARE_PROVIDER_SITE_OTHER): Payer: Medicare Other | Admitting: Physician Assistant

## 2012-08-19 VITALS — BP 124/62 | HR 76 | Ht 63.0 in | Wt 128.0 lb

## 2012-08-19 DIAGNOSIS — K529 Noninfective gastroenteritis and colitis, unspecified: Secondary | ICD-10-CM

## 2012-08-19 DIAGNOSIS — R197 Diarrhea, unspecified: Secondary | ICD-10-CM

## 2012-08-19 DIAGNOSIS — K5289 Other specified noninfective gastroenteritis and colitis: Secondary | ICD-10-CM

## 2012-08-19 DIAGNOSIS — K625 Hemorrhage of anus and rectum: Secondary | ICD-10-CM

## 2012-08-19 DIAGNOSIS — K5090 Crohn's disease, unspecified, without complications: Secondary | ICD-10-CM

## 2012-08-19 LAB — CBC WITH DIFFERENTIAL/PLATELET
Basophils Relative: 1.2 % (ref 0.0–3.0)
Eosinophils Absolute: 0 10*3/uL (ref 0.0–0.7)
HCT: 41.2 % (ref 36.0–46.0)
Hemoglobin: 13.4 g/dL (ref 12.0–15.0)
MCHC: 32.7 g/dL (ref 30.0–36.0)
MCV: 77.8 fl — ABNORMAL LOW (ref 78.0–100.0)
Monocytes Absolute: 0.8 10*3/uL (ref 0.1–1.0)
Neutro Abs: 5.5 10*3/uL (ref 1.4–7.7)
RBC: 5.29 Mil/uL — ABNORMAL HIGH (ref 3.87–5.11)

## 2012-08-19 MED ORDER — BUDESONIDE 9 MG PO TB24: 1.0000 | ORAL_TABLET | Freq: Every day | ORAL | Status: DC

## 2012-08-19 MED ORDER — HYOSCYAMINE SULFATE 0.125 MG PO TBDP: ORAL_TABLET | ORAL | Status: DC

## 2012-08-19 NOTE — Patient Instructions (Addendum)
Please go to the basement level to have your labs drawn and a stool study. Stay on Colazal,  6 tablets daily. We sent prescriptions to [facility] [place] for hyoscyamine and Uceris ( samples provided).

## 2012-08-19 NOTE — Telephone Encounter (Signed)
Pt with hx of Crohn's and had CDIFF last year. Daughter called this am to report pt has been having cramping and a tender abdomen for a few days and yesterday began having bloody diarrhea. At pt's age, thought it best pt be seen; will see Mike Gip, PA at 1:30 today.

## 2012-08-19 NOTE — Progress Notes (Signed)
Subjective:    Patient ID: [name], female    DOB: 05/22/1915, 79 y.o.   MRN: [id number]  HPI [name] is a very nice 79 year old white female known to Dr. [name] who has history of a chronic left-sided colitis indeterminate but felt to be most consistent with Crohns. She was last seen in August of 2013 and at that time was doing well. She has been maintained on Colazal  750; 3 tablets twice daily. She does have history of C. difficile colitis in the spring of 2013. Last colonoscopy was done in June of 2012 with finding of mild left-sided colitis and moderate left colon diverticulosis. Patient comes in today as an urgent add-on with complaints of onset last week initially with some dizziness which was present for a couple of days and then resolved. She started noticing loose stools postprandially and this has progressed somewhat. She noticed blood in her stool for the first time last night and again this morning and says the bowel movement was normal in color with bright red blood mixed in . She has not had any fever or chills. She does state that she has been feeling fatigued. No nausea or vomiting, appetite has been fine weight has been stable. She's generally been having 4-5 bowel movements per day again with postprandial cramping and urgency. Her last antibiotic was about 3 months ago, a Z-Pak for bronchitis.    Review of Systems  Constitutional: Positive for fatigue.  HENT: Negative.   Eyes: Negative.   Respiratory: Positive for cough.   Cardiovascular: Negative.   Gastrointestinal: Positive for abdominal pain, diarrhea and blood in stool.  Endocrine: Negative.   Genitourinary: Negative.   Musculoskeletal: Negative.   Allergic/Immunologic: Negative.   Neurological: Positive for weakness.  Hematological: Negative.   Psychiatric/Behavioral: Negative.    Allergies  Allergen Reactions  . Codeine Phosphate     REACTION: unspecified  . Ivp Dye (Iodinated Diagnostic Agents) Other  (See Comments)    Made her feel like she was smothering  . Levofloxacin Hives  . Moxifloxacin   . Penicillins   . Amoxicillin Rash    REACTION: unspecified  . Doxycycline Rash  . Sulfa Antibiotics Rash   Outpatient Prescriptions Prior to Visit  Medication Sig Dispense Refill  . albuterol (PROVENTIL HFA;VENTOLIN HFA) 108 (90 BASE) MCG/ACT inhaler Inhale 2 puffs into the lungs 4 (four) times daily.      Marland Kitchen amLODipine (NORVASC) 5 MG tablet Take 5 mg by mouth daily.        Marland Kitchen aspirin 325 MG tablet Take 325 mg by mouth daily.        . AZO-CRANBERRY PO Take 2 capsules by mouth daily. As needed      . balsalazide (COLAZAL) 750 MG capsule TAKE THREE CAPSULES BY MOUTH TWO TIMES A DAY  180 capsule  2  . esomeprazole (NEXIUM) 40 MG capsule Take 40 mg by mouth 2 (two) times daily.       . Fluticasone-Salmeterol (ADVAIR DISKUS) 100-50 MCG/DOSE AEPB Inhale 1 puff into the lungs every 12 (twelve) hours.        . gabapentin (NEURONTIN) 300 MG capsule Take one tablet in the morning and take two tablets at bedtime.      Marland Kitchen HYDROcodone-acetaminophen (NORCO) 10-325 MG per tablet Take 1 tablet by mouth as needed.        Marland Kitchen levothyroxine (SYNTHROID, LEVOTHROID) 50 MCG tablet Take 50 mcg by mouth daily.       . metoprolol tartrate (LOPRESSOR) 25  MG tablet Take 25 mg by mouth 2 (two) times daily.        . Multiple Vitamin (MULTIVITAMIN) capsule Take 1 capsule by mouth daily.        Marland Kitchen NASONEX 50 MCG/ACT nasal spray Place 2 sprays into the nose daily.       . Omega-3 Fatty Acids (FISH OIL) 1000 MG CAPS Take 1 capsule by mouth 2 (two) times daily.        . Phenazopyridine HCl (AZO TABS PO) Take by mouth. As directed, for bladder      . Polyethyl Glycol-Propyl Glycol (SYSTANE OP) Place 2 drops into both eyes at bedtime. As directed      . Probiotic Product (PROBIOTIC PO) Take 1 capsule by mouth daily.      . Psyllium 28 % POWD Take one teaspoon once a day       . hyoscyamine (ANASPAZ) 0.125 MG TBDP Place 0.125 mg  under the tongue. Three as needed once daily      . celecoxib (CELEBREX) 100 MG capsule Take 100 mg by mouth daily. As needed pain      . Ferrous Fum-Iron Polysacch-FA 162-115.2-1 MG CAPS Take 1 capsule by mouth daily. ON HOLD       No facility-administered medications prior to visit.   Patient Active Problem List   Diagnosis Date Noted  . Pseudomembranous colitis 08/07/2011  . Colitis 08/06/2011  . Hemorrhage of rectum and anus 09/04/2010  . Colitis, ulcerative 09/04/2010  . HYPERTENSION 11/13/2008  . RBBB 11/13/2008  . PREMATURE VENTRICULAR CONTRACTIONS 11/13/2008  . MURMUR 11/13/2008  . HYPOTHYROIDISM 06/30/2007  . HYPERLIPIDEMIA 06/30/2007  . CAD 06/30/2007  . PERIPHERAL VASCULAR DISEASE 06/30/2007  . ASTHMA 06/30/2007  . GASTROESOPHAGEAL REFLUX DISEASE 06/30/2007  . HIATAL HERNIA 06/30/2007  . CROHN'S DISEASE 06/30/2007  . DIVERTICULOSIS, COLON 06/30/2007  . UTI'S, RECURRENT 06/30/2007  . ARTHRITIS 06/30/2007  . SPINAL STENOSIS 06/30/2007   History  Substance Use Topics  . Smoking status: Never Smoker   . Smokeless tobacco: Never Used  . Alcohol Use: No   family history includes Colon cancer in her father and sister; Diabetes in her brother and daughter; and Heart disease in her brother and mother.     Objective:   Physical Exam  well-developed elderly white female in a wheelchair, accompanied by her daughter, pleasant and appears younger than stated age blood pressure 124/62 pulse 76 height 5 foot 3 weight 128. HEENT; nontraumatic normocephalic EOMI PERRLA sclera anicteric,Neck;; Supple no JVD, Cardiovascular; regular rate and rhythm with S1-S2 soft systolic murmur, Pulmonary; scattered rhonchi bilaterally, Abdomen; soft nondistended bowel sounds are active she has mild rather generalized abdominal  tenderness ,no  guarding or rebound, no palpable mass or hepatosplenomegaly, Rectal; exam not done, Extremities; no clubbing cyanosis or edema skin warm and dry, Psych; mood  and affect normal and appropriate.        Assessment & Plan:  #  79 year old female with indeterminant/Crohn's colitis with complaints of one-week history of abdominal cramping new onset of diarrhea urgency and small volume hematochezia . Suspect she is having an exacerbation of her colitis. Given prior history of C. difficile colitis will also rule out recurrent C. Difficile. #2 diverticulosis #3 GERD #4 coronary artery disease #5 asthma/chronic bronchitis #6 arthritis  Plan; CBC with differential and stool for C. difficile PCR Continue Colazal 750 -3 tablets twice daily Add a trial of Uceris 9 milligrams once daily-patient was given samples today enough to last for a couple  of weeks and prescription called  Continue probiotic Refill Levsin for when necessary use Followup with myself or Dr. [name] in 2 weeks or sooner as needed.

## 2012-08-20 ENCOUNTER — Other Ambulatory Visit: Payer: Medicare Other

## 2012-08-20 ENCOUNTER — Telehealth: Payer: Self-pay | Admitting: Physician Assistant

## 2012-08-20 DIAGNOSIS — R197 Diarrhea, unspecified: Secondary | ICD-10-CM

## 2012-08-20 DIAGNOSIS — K529 Noninfective gastroenteritis and colitis, unspecified: Secondary | ICD-10-CM

## 2012-08-20 DIAGNOSIS — K625 Hemorrhage of anus and rectum: Secondary | ICD-10-CM

## 2012-08-20 NOTE — Telephone Encounter (Signed)
Spoke with patient's daughter and gave her lab results.

## 2012-08-25 ENCOUNTER — Telehealth: Payer: Self-pay | Admitting: Physician Assistant

## 2012-08-25 MED ORDER — PREDNISONE 10 MG PO TABS: 20.0000 mg | ORAL_TABLET | Freq: Every day | ORAL | Status: DC

## 2012-08-25 NOTE — Telephone Encounter (Signed)
Glenda Esterwood, PA saw this pt last week for loose stools and stools with blood. Hx of CDIFF last year and left sided colitis. Glenda placed pt on Uceris 9gm/day and increased Colazal to 3 tabs BID. Daughter, frances called to report pt is no better; cramping in the middle of her abdomen and she continues with loose stools and has noticed blood again. She denies a fever. Daughter states she usually gets prednisone, but Glenda tried Uceris. I cannot find any note where Prednisone was used.' Please advise. Thanks.

## 2012-08-25 NOTE — Telephone Encounter (Signed)
Add prednisone 20 mg a day with office visit next week

## 2012-08-25 NOTE — Telephone Encounter (Signed)
Informed Scarlette Calico of prednisone order and scheduled pt to see Dr [name] on 09/02/12. Scarlette Calico will call if pt worsens or for further problems.

## 2012-08-26 ENCOUNTER — Telehealth: Payer: Self-pay | Admitting: *Deleted

## 2012-08-26 NOTE — Telephone Encounter (Signed)
Ordered prednisone. Pt to have a flex sig tomorrow.

## 2012-08-26 NOTE — Telephone Encounter (Signed)
Spoke with Scarlette Calico, pt's daughter to inform her Dr [name] does not want pt to go over the weekend if something worse is wrong with her. He wants to do a [employer] tomorrow; pt may do an enema in am and have another one on arrival. Starling Manns prep instructions for pt to have a clear liquid dinner tonight and in the am. She should use a Fleet[employer] enema around 10am tomorrow before she leaves for her. She will have another enemas here in am. please arrive at 12N; Scarlette Calico stated understanding.

## 2012-08-27 ENCOUNTER — Encounter: Payer: Self-pay | Admitting: Gastroenterology

## 2012-08-27 ENCOUNTER — Telehealth: Payer: Self-pay | Admitting: Internal Medicine

## 2012-08-27 ENCOUNTER — Other Ambulatory Visit: Payer: Self-pay | Admitting: *Deleted

## 2012-08-27 ENCOUNTER — Ambulatory Visit (AMBULATORY_SURGERY_CENTER): Payer: Medicare Other | Admitting: Gastroenterology

## 2012-08-27 VITALS — BP 148/79 | HR 77 | Temp 98.4°F | Resp 23 | Ht 63.0 in | Wt 128.0 lb

## 2012-08-27 DIAGNOSIS — K5090 Crohn's disease, unspecified, without complications: Secondary | ICD-10-CM

## 2012-08-27 DIAGNOSIS — K529 Noninfective gastroenteritis and colitis, unspecified: Secondary | ICD-10-CM

## 2012-08-27 DIAGNOSIS — K5289 Other specified noninfective gastroenteritis and colitis: Secondary | ICD-10-CM

## 2012-08-27 DIAGNOSIS — K625 Hemorrhage of anus and rectum: Secondary | ICD-10-CM

## 2012-08-27 MED ORDER — FLEET ENEMA 7-19 GM/118ML RE ENEM
1.0000 | ENEMA | Freq: Once | RECTAL | Status: AC
  Administered 2012-08-27: 1 via RECTAL

## 2012-08-27 MED ORDER — MESALAMINE 1000 MG RE SUPP: 1000.0000 mg | Freq: Every day | RECTAL | Status: DC

## 2012-08-27 MED ORDER — SODIUM CHLORIDE 0.9 % IV SOLN: 500.0000 mL | INTRAVENOUS | Status: DC

## 2012-08-27 NOTE — Progress Notes (Signed)
Patient did not have preoperative order for IV antibiotic SSI prophylaxis. (G8918)  Patient did not experience any of the following events: a burn prior to discharge; a fall within the facility; wrong site/side/patient/procedure/implant event; or a hospital transfer or hospital admission upon discharge from the facility. (G8907)  

## 2012-08-27 NOTE — Progress Notes (Signed)
Procedure ends. To recovery awake. Report given. VSS

## 2012-08-27 NOTE — Progress Notes (Signed)
Called to room to assist during endoscopic procedure.  Patient ID and intended procedure confirmed with present staff. Received instructions for my participation in the procedure from the performing physician.  

## 2012-08-27 NOTE — Telephone Encounter (Signed)
The patient passed a blood clot the size of index finger this AM. Some abdominal cramping and diarrhea. Is to have sigmoidoscopy this PM. Advised continue preparing for that and call back prn

## 2012-08-27 NOTE — Patient Instructions (Addendum)

## 2012-08-27 NOTE — Op Note (Signed)
[facility] [address] [place], [address]   FLEXIBLE SIGMOIDOSCOPY PROCEDURE REPORT  PATIENT: [name], [name]  MR#: [id number] BIRTHDATE: [birth year] , 89  yrs. old GENDER: Female ENDOSCOPIST: Mardella Layman, MD, [facility] REFERRED BY: PROCEDURE DATE:  08/27/2012 PROCEDURE:   Sigmoidoscopy with biopsy ASA CLASS:   Class II INDICATIONS:rectal bleeding. MEDICATIONS: Propofol (Diprivan) 70 mg IV  DESCRIPTION OF PROCEDURE:   After the risks benefits and alternatives of the procedure were thoroughly explained, informed consent was obtained.  revealed no abnormalities of the rectum. The [id number]  endoscope was introduced through the anus  and advanced to the descending colon , limited by No adverse events experienced.   The quality of the prep was excellent .  The instrument was then slowly withdrawn as the mucosa was fully examined.       1.  COLON FINDINGS: Images taken but only available in hard copy form that will be scanned into [employer]). .  The colonoscope was easily advanced to the descending colon. There is an exudative colitis from 5-25 cm with edema, erythema, exudate and diverticulosis but no erosions or ulcerations or active bleeding.  Multiple pictures were obtained and multiple biopsies. Mucosa above the descending colon was normal in the very tip of the rectum appeared normal.  These findings were all felt to be consistent with segmental colitis-ulcerative colitis. Retroflexion was not performed.    The scope was then withdrawn from the patient and the procedure terminated.  COMPLICATIONS: There were no complications.  ENDOSCOPIC IMPRESSION:mild left sided ulcerative colitis.   RECOMMENDATIONS:continue her oral amino salicylates, and Canasa 1 g suppositories at bedtime, and give her several weeks of prednisone 20 mg a day with office followup in 2-3 weeks time.   REPEAT  EXAM:   _______________________________ eSignedMardella Layman, MD, [facility] 08/27/2012 12:34 PM   CC:  PATIENT NAME:  [name], [name] MR#: [id number]

## 2012-08-30 ENCOUNTER — Telehealth: Payer: Self-pay | Admitting: *Deleted

## 2012-08-30 NOTE — Telephone Encounter (Signed)
  Follow up Call-  Call back number 08/27/2012 09/04/2010  Post procedure Call Back phone  # [phone]  Permission to leave phone message Yes -     Patient questions:  Do you have a fever, pain , or abdominal swelling? no Pain Score  0 *  Have you tolerated food without any problems? yes  Have you been able to return to your normal activities? yes  Do you have any questions about your discharge instructions: Diet   no Medications  yes Follow up visit  yes  Do you have questions or concerns about your Care? yes  Actions: * If pain score is 4 or above: No action needed, pain <4.  Patient states that she still has minimum rectal bleeding with wiping.  She stated that it was not a half cup.  No pain.  Told her to keep taking her meds, and to call us if bleeding gets worse.   She does have ulcerative colitis.

## 2012-09-01 ENCOUNTER — Encounter: Payer: Self-pay | Admitting: Gastroenterology

## 2012-09-02 ENCOUNTER — Ambulatory Visit: Payer: Medicare Other | Admitting: Gastroenterology

## 2012-09-07 ENCOUNTER — Ambulatory Visit (INDEPENDENT_AMBULATORY_CARE_PROVIDER_SITE_OTHER): Payer: Medicare Other | Admitting: Gastroenterology

## 2012-09-07 ENCOUNTER — Encounter: Payer: Self-pay | Admitting: Gastroenterology

## 2012-09-07 ENCOUNTER — Other Ambulatory Visit (INDEPENDENT_AMBULATORY_CARE_PROVIDER_SITE_OTHER): Payer: Medicare Other

## 2012-09-07 VITALS — BP 120/62 | HR 77 | Ht 63.0 in | Wt 129.0 lb

## 2012-09-07 DIAGNOSIS — E039 Hypothyroidism, unspecified: Secondary | ICD-10-CM

## 2012-09-07 DIAGNOSIS — K5090 Crohn's disease, unspecified, without complications: Secondary | ICD-10-CM

## 2012-09-07 DIAGNOSIS — K625 Hemorrhage of anus and rectum: Secondary | ICD-10-CM

## 2012-09-07 DIAGNOSIS — I2510 Atherosclerotic heart disease of native coronary artery without angina pectoris: Secondary | ICD-10-CM

## 2012-09-07 DIAGNOSIS — K50111 Crohn's disease of large intestine with rectal bleeding: Secondary | ICD-10-CM

## 2012-09-07 DIAGNOSIS — K219 Gastro-esophageal reflux disease without esophagitis: Secondary | ICD-10-CM

## 2012-09-07 DIAGNOSIS — K5010 Crohn's disease of large intestine without complications: Secondary | ICD-10-CM

## 2012-09-07 DIAGNOSIS — I7090 Unspecified atherosclerosis: Secondary | ICD-10-CM

## 2012-09-07 DIAGNOSIS — E785 Hyperlipidemia, unspecified: Secondary | ICD-10-CM

## 2012-09-07 DIAGNOSIS — I10 Essential (primary) hypertension: Secondary | ICD-10-CM

## 2012-09-07 DIAGNOSIS — E164 Increased secretion of gastrin: Secondary | ICD-10-CM

## 2012-09-07 LAB — CBC WITH DIFFERENTIAL/PLATELET
Basophils Relative: 0.1 % (ref 0.0–3.0)
Eosinophils Absolute: 0 10*3/uL (ref 0.0–0.7)
MCHC: 31.9 g/dL (ref 30.0–36.0)
MCV: 78.8 fl (ref 78.0–100.0)
Monocytes Absolute: 0.2 10*3/uL (ref 0.1–1.0)
Neutrophils Relative %: 88.8 % — ABNORMAL HIGH (ref 43.0–77.0)
Platelets: 351 10*3/uL (ref 150.0–400.0)
RBC: 5.43 Mil/uL — ABNORMAL HIGH (ref 3.87–5.11)

## 2012-09-07 LAB — HEPATIC FUNCTION PANEL
AST: 13 U/L (ref 0–37)
Albumin: 3.7 g/dL (ref 3.5–5.2)
Alkaline Phosphatase: 51 U/L (ref 39–117)
Total Protein: 6.9 g/dL (ref 6.0–8.3)

## 2012-09-07 LAB — TSH: TSH: 2.58 u[IU]/mL (ref 0.35–5.50)

## 2012-09-07 LAB — BASIC METABOLIC PANEL
BUN: 21 mg/dL (ref 6–23)
GFR: 67.88 mL/min (ref >60.00)
Potassium: 4.4 mEq/L (ref 3.5–5.1)

## 2012-09-07 LAB — IBC PANEL: Transferrin: 249.9 mg/dL (ref 212.0–360.0)

## 2012-09-07 NOTE — Patient Instructions (Addendum)
  Please make a follow up visit with Dr.Patterson in three weeks Please continue your Nexium, Canasa and Prednisone Your physician has requested that you go to the basement for the following lab work before leaving today: CBC TSH BMP Liver Function Test Anemia Panel Celiac Gastrin  Sedimentation Rate  _______________________________________________________________                                               We are excited to introduce MyChart, a new best-in-class service that provides you online access to important information in your electronic medical record. We want to make it easier for you to view your health information - all in one secure location - when and where you need it. We expect MyChart will enhance the quality of care and service we provide.  When you register for MyChart, you can:    View your test results.    Request appointments and receive appointment reminders via email.    Request medication renewals.    View your medical history, allergies, medications and immunizations.    Communicate with your physician's office through a password-protected site.    Conveniently print information such as your medication lists.  To find out if MyChart is right for you, please talk to a member of our clinical staff today. We will gladly answer your questions about this free health and wellness tool.  If you are age 77 or older and want a member of your family to have access to your record, you must provide written consent by completing a proxy form available at our office. Please speak to our clinical staff about guidelines regarding accounts for patients younger than age 64.  As you activate your MyChart account and need any technical assistance, please call the MyChart technical support line at [phone]-[phone]) or email your question to [email or web address]. If you email your question(s), please include your name, a return phone number and the best time  to reach you.  If you have non-urgent health-related questions, you can send a message to our office through MyChart at [email or web address]. If you have a medical emergency, call 911.  Thank you for using MyChart as your new health and wellness resource!   MyChart licensed from [employer],  2956-2130. Patents Pending.

## 2012-09-07 NOTE — Progress Notes (Signed)
This is a 79 year old Caucasian female with late onset segmental colitis associated with diverticulosis (SCAD} who recently had flexible sigmoidoscopy which confirmed left-sided Crohn's disease type colitis.  She has severe colitis one year ago was positive for C. difficile and required hospitalization.  However, she is been on chronic aminosalicylate therapy had a recent relapse with rectal bleeding, diarrhea, and lower abdominal discomfort which is required institution of prednisone 20 mg a day in addition to her oral amino salicylates.  She also is been placed on Canasa 1 g suppositories at bedtime.  Repeat stool exam for C. difficile negative and blood work was normal.  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  She denies any upper GI or hepatobiliary complaints.  Her diarrhea and rectal bleeding or abdominal cramping have markedly diminished since her flexible sigmoidoscopy exam.  She seemed to be eating better but complains of some continued dizziness and shakiness.  There is no fever, chills, or other systemic complaints, cardiovascular or pulmonary symptomatology.  She has had previous cardiac stenting.  She is on multiple medications listed and reviewed.  She is accompanied by her daughter throughout the interview and exam  Current Medications, Allergies, Past Medical History, Past Surgical History, Family History and Social History were reviewed in [employer] record.  ROS: All systems were reviewed and are negative unless otherwise stated in the HPI.          Physical Exam: Blood pressure 120/62, pulse 77 and regular and weight 129.  I cannot appreciate stigmata of chronic liver disease.  She appears healthy and in no acute distress.  Her chest is clear and she appear to be in a regular rhythm without murmurs gallops or rubs.  Her abdomen shows no organomegaly, masses, tenderness, distention.  Bowel sounds are  normal.  Mental status is normal.  Rectal exam is deferred.    Assessment and Plan:SCAD with recent relapse responding to moderate doses of prednisone.  We will continue prednisone 20 mg a day along with oral and topical amino salicylates with office followup in 3 weeks' time.  Hopefully he'll be able to taper her prednisone at that time.  I am reluctant to start immunosuppressant therapy and 79 year old patient.  She seems to be doing fairly well otherwise from a clinical aspect.  I will repeat her labs, iron studies, and serum gastrin level.  I've urged her to take twice a day Nexium.  CT scan at [facility] one year ago showed diverticulosis, a large hiatal hernia, a calcified aortic valve and coronary artery calcification but no evidence of any other mass lesions in the upper abdomen to suggest metastatic gastrinomas.  I have urged her to continue all her other medications as listed and reviewed.  We will send a copy this note to her primary care physician in [facility].     Please copy her primary care physician, referring physician, and pertinent subspecialists. No diagnosis found.

## 2012-09-08 LAB — CELIAC PANEL 10
Endomysial Screen: NEGATIVE
Gliadin IgA: 3.2 U/mL (ref <20)
Gliadin IgG: 7.3 U/mL (ref <20)
Tissue Transglut Ab: 16.8 U/mL (ref <20)
Tissue Transglutaminase Ab, IgA: 2.8 U/mL (ref <20)

## 2012-09-10 ENCOUNTER — Telehealth: Payer: Self-pay | Admitting: *Deleted

## 2012-09-10 ENCOUNTER — Ambulatory Visit: Payer: Medicare Other | Admitting: Gastroenterology

## 2012-09-10 MED ORDER — FERROUS FUM-IRON POLYSACCH-FA 162-115.2-1 MG PO CAPS: 1.0000 | ORAL_CAPSULE | Freq: Every day | ORAL | Status: DC

## 2012-09-10 MED ORDER — CYANOCOBALAMIN 1000 MCG/ML IJ SOLN: INTRAMUSCULAR | Status: DC

## 2012-09-10 NOTE — Telephone Encounter (Signed)
Message copied by Florene Glen on Fri Sep 10, 2012  1:57 PM ------      Message from: [name], DAVID R      Created: Wed Sep 08, 2012  8:17 AM       His B12 shots and nasal spray and also daily tandem. ------

## 2012-09-10 NOTE — Telephone Encounter (Signed)
Informed daughter Scarlette Calico of the need for Tandem and Vit B12 injections and that I will call pt's PCP to request they give the injections since we can't get any. Faxed orders and notes to Dr Lois Huxley' ofc and he will review and call pt if he can give the injections. Ph [phone]    Fax [phone]

## 2012-09-14 ENCOUNTER — Telehealth: Payer: Self-pay | Admitting: Gastroenterology

## 2012-09-14 MED ORDER — CYANOCOBALAMIN 1000 MCG/ML IJ SOLN: INTRAMUSCULAR | Status: DC

## 2012-09-14 NOTE — Telephone Encounter (Signed)
Informed daughter Scarlette Calico that I ordered the B12 at [facility]; she stated understanding.

## 2012-09-21 ENCOUNTER — Other Ambulatory Visit: Payer: Self-pay | Admitting: Gastroenterology

## 2012-09-21 NOTE — Telephone Encounter (Signed)
Assessment and Plan:SCAD with recent relapse responding to moderate doses of prednisone. We will continue prednisone 20 mg a day along with oral and topical amino salicylates with office followup in 3 weeks' time. Hopefully he'll be able to taper her prednisone at that time. I am reluctant to start immunosuppressant therapy and 79 year old patient. She seems to be doing fairly well otherwise from a clinical aspect. I will repeat her labs, iron studies, and serum gastrin level. I've urged her to take twice a day Nexium. CT scan at [facility] one year ago showed diverticulosis, a large hiatal hernia, a calcified aortic valve and coronary artery calcification but no evidence of any other mass lesions in the upper abdomen to suggest metastatic gastrinomas. I have urged her to continue all her other medications as listed and reviewed. We will send a copy this note to her primary care physician in [facility]. Called daughter, Scarlette Calico to schedule a f/u for 09/29/12 and she will continue the prednisone until then at 20 mg daily. Ok to refill the prednisone, Tresa Endo.

## 2012-09-21 NOTE — Telephone Encounter (Signed)
Request for prednisone refill Patient was suppose to have follow up visit in three weeks Do we refill?

## 2012-09-29 ENCOUNTER — Encounter: Payer: Self-pay | Admitting: Gastroenterology

## 2012-09-29 ENCOUNTER — Ambulatory Visit (INDEPENDENT_AMBULATORY_CARE_PROVIDER_SITE_OTHER): Payer: Medicare Other | Admitting: Gastroenterology

## 2012-09-29 VITALS — BP 120/60 | HR 82 | Ht 63.0 in

## 2012-09-29 DIAGNOSIS — K51211 Ulcerative (chronic) proctitis with rectal bleeding: Secondary | ICD-10-CM

## 2012-09-29 DIAGNOSIS — K5120 Ulcerative (chronic) proctitis without complications: Secondary | ICD-10-CM

## 2012-09-29 DIAGNOSIS — E164 Increased secretion of gastrin: Secondary | ICD-10-CM

## 2012-09-29 DIAGNOSIS — E538 Deficiency of other specified B group vitamins: Secondary | ICD-10-CM

## 2012-09-29 DIAGNOSIS — D509 Iron deficiency anemia, unspecified: Secondary | ICD-10-CM

## 2012-09-29 MED ORDER — PREDNISONE 10 MG PO TABS: ORAL_TABLET | ORAL | Status: DC

## 2012-09-29 NOTE — Patient Instructions (Signed)
We have sent the following medications to your pharmacy for you to pick up at your convenience: Prednisone    Follow up with [place] in a month.

## 2012-09-29 NOTE — Progress Notes (Signed)
This is a 79-year-old Caucasian female with recurrent left sided ulcerative colitis.  She currently is on prednisone 20 mg a day and appears to be coming in to remission with 2-3 nonbloody bowel movements a day and decreased left lower quadrant discomfort.  She also takes Canasa 1 g suppositories at bedtime.  She also has Zollinger-Ellison syndrome with serum gastrin levels over 2000, and is on twice a day Nexium.  She recently did take a Z-Pak is been on probiotics, and had a negative stool for C. difficile.  Patient feels much better symptomatically and has increased strength since being on iron and B12 replacement.  Her appetite is good her weight is increasing.  She does have some moon facies from her prednisone but denies other side effects.  She had no previous response to Entocort therapy.  As usual her daughter is with her during the interview and exam.  Current Medications, Allergies, Past Medical History, Past Surgical History, Family History and Social History were reviewed in [employer] record.  ROS: All systems were reviewed and are negative unless otherwise stated in the HPI.          Physical Exam: At pressure 120/60, pulse 82 and weight not recorded since she is in a wheelchair.  She appears healthy and in no distress.  Her abdominal exam shows no organomegaly, masses or tenderness.  Bowel sounds are normal.  Mental status is normal.  Patient is cheerful and happy today.    Assessment and Plan: Left-sided altered colitis coming in remission on moderate doses of corticosteroids.  We will try to taper her prednisone 5 mg a day every week as tolerated while continuing to nostril 1 g suppositories at bedtime.  For her ZES WE will continue twice a day Nexium therapy.  Previous CT scans have not shown any evidence of metastatic gastrinoma's.  I will see her back in one month's time for followup.  She is to continue her B12 and iron replacement discomfort on lab  testing.  Sedimentation rate has not been elevated and cannot be used as an inflammatory marker. No diagnosis found.

## 2012-10-27 ENCOUNTER — Other Ambulatory Visit: Payer: Self-pay | Admitting: Gastroenterology

## 2012-11-09 ENCOUNTER — Ambulatory Visit (INDEPENDENT_AMBULATORY_CARE_PROVIDER_SITE_OTHER): Payer: Medicare Other | Admitting: Gastroenterology

## 2012-11-09 ENCOUNTER — Encounter: Payer: Self-pay | Admitting: Gastroenterology

## 2012-11-09 VITALS — BP 136/64 | HR 79 | Ht 63.0 in

## 2012-11-09 DIAGNOSIS — K219 Gastro-esophageal reflux disease without esophagitis: Secondary | ICD-10-CM

## 2012-11-09 DIAGNOSIS — K5730 Diverticulosis of large intestine without perforation or abscess without bleeding: Secondary | ICD-10-CM

## 2012-11-09 DIAGNOSIS — K5010 Crohn's disease of large intestine without complications: Secondary | ICD-10-CM

## 2012-11-09 DIAGNOSIS — D379 Neoplasm of uncertain behavior of digestive organ, unspecified: Secondary | ICD-10-CM

## 2012-11-09 NOTE — Progress Notes (Signed)
This is a 79 year old Caucasian female with segmental colitis, severe asthma and emphysema, and recurrent urinary tract infection she recently has been on Keflex and gentamicin for cellulitis in her left leg.  I've been treated her for flares of her segmental colitis over the last several months, and she is finally been able to taper off of prednisone, and she continues on oral and topical amino salicylates.  She currently is doing well without diarrhea rectal bleeding or abdominal pain.  She also has hypergastrinemia and is on Nexium 40 mg twice a day.  She comes in today for followup, and has no symptoms to suggest C. difficile relapse.  He is followed closely by Dr. Leo Grosser in [facility].  Current Medications, Allergies, Past Medical History, Past Surgical History, Family History and Social History were reviewed in [employer] record.  ROS: All systems were reviewed and are negative unless otherwise stated in the HPI.          Physical Exam: Healthy-appearing patient in no distress.  She appears much younger than her stated age.  Blood pressure 136/64, pulse 79, and weight was not obtained since she is in a wheelchair.  96% oxygen saturation.  Her chest is generally clear except for scattered rhonchi in both air fields anteriorly she appear to be in a regular rhythm without murmurs gallops or rubs.  Abdomen is soft and nontender without organomegaly, masses, and bowel sounds are normal.  Peripheral extremities shows some redness of the left anterior shin area with slight edema but no evidence of acute cellulitis.  Right leg appears normal.  Mental status is normal    Assessment and Plan:SCAD , segmental colitis under control on oral and topical amino salicylates patella continue the same doses.  She's taper off of prednisone, and hopefully she will not have recurrent urinary tract infections, cellulitis, or C. difficile relapse.  I've asked her daughter to  place her on Florstar probiotic for the next 2 weeks.  She has a relapse of her C. Difficile, we will treat her with Dificid which is a bacteriostatic drug for C. difficile.  I will see her on when necessary bases otherwise as needed.  There has been no evidence of malignant gastrinoma.

## 2012-11-09 NOTE — Patient Instructions (Signed)
Please follow up with Dr. Patterson in one year. 

## 2012-11-17 ENCOUNTER — Other Ambulatory Visit: Payer: Self-pay | Admitting: Gastroenterology

## 2012-12-30 ENCOUNTER — Other Ambulatory Visit: Payer: Self-pay | Admitting: Gastroenterology

## 2013-07-11 ENCOUNTER — Telehealth: Payer: Self-pay | Admitting: Gastroenterology

## 2013-07-11 NOTE — Telephone Encounter (Signed)
Pt has h/o UC, daughter states that she has started to have some abdominal cramping and has noticed some blood in her stool. Requesting pt be seen. Pt scheduled to see Nicoletta Ba PA tomorrow at 2pm. Daughter aware of appt date and time.

## 2013-07-12 ENCOUNTER — Ambulatory Visit (INDEPENDENT_AMBULATORY_CARE_PROVIDER_SITE_OTHER): Payer: Medicare Other | Admitting: Physician Assistant

## 2013-07-12 ENCOUNTER — Other Ambulatory Visit (INDEPENDENT_AMBULATORY_CARE_PROVIDER_SITE_OTHER): Payer: Medicare Other

## 2013-07-12 ENCOUNTER — Encounter: Payer: Self-pay | Admitting: Physician Assistant

## 2013-07-12 VITALS — BP 90/60 | HR 82 | Ht 63.0 in

## 2013-07-12 DIAGNOSIS — K449 Diaphragmatic hernia without obstruction or gangrene: Secondary | ICD-10-CM

## 2013-07-12 DIAGNOSIS — K5190 Ulcerative colitis, unspecified, without complications: Secondary | ICD-10-CM

## 2013-07-12 LAB — CBC WITH DIFFERENTIAL/PLATELET
Basophils Absolute: 0 10*3/uL (ref 0.0–0.1)
Basophils Relative: 0.5 % (ref 0.0–3.0)
EOS PCT: 0 % (ref 0.0–5.0)
Eosinophils Absolute: 0 10*3/uL (ref 0.0–0.7)
HEMATOCRIT: 38.3 % (ref 36.0–46.0)
HEMOGLOBIN: 12.1 g/dL (ref 12.0–15.0)
LYMPHS ABS: 2.2 10*3/uL (ref 0.7–4.0)
Lymphocytes Relative: 23.4 % (ref 12.0–46.0)
MCHC: 31.7 g/dL (ref 30.0–36.0)
MONO ABS: 0.8 10*3/uL (ref 0.1–1.0)
Monocytes Relative: 8 % (ref 3.0–12.0)
Neutro Abs: 6.4 10*3/uL (ref 1.4–7.7)
Neutrophils Relative %: 68.1 % (ref 43.0–77.0)
Platelets: 337 10*3/uL (ref 150.0–400.0)
RBC: 5.56 Mil/uL — AB (ref 3.87–5.11)
RDW: 17.3 % — ABNORMAL HIGH (ref 11.5–14.6)
WBC: 9.5 10*3/uL (ref 4.5–10.5)

## 2013-07-12 MED ORDER — HYOSCYAMINE SULFATE 0.125 MG PO TBDP: ORAL_TABLET | ORAL | Status: DC

## 2013-07-12 MED ORDER — PREDNISONE 10 MG PO TABS: ORAL_TABLET | ORAL | Status: DC

## 2013-07-12 NOTE — Patient Instructions (Addendum)
We had the lab come to our floor to draw your blood.  Stay on Colazal the current dose.  We sent a prescription for Prednisone 10 mg tablets to [facility], [place]. Take 2 tab daily for 21 days Take 1 1/2 tab daily for 14 days. Take 1 tab daily for 14 days. Take 1/2 tab daily for 14 days.  Stop taking the Celebrex.

## 2013-07-12 NOTE — Progress Notes (Addendum)
Subjective:    Patient ID: CYNIAH GOSSARD, female    DOB: 1915-06-19, 80 y.o.   MRN: [id number]  HPI  Shalita is a very nice 80 year old white female known previously to Dr. Sharlett Iles. She has a long history of diverticular disease and what was felt to be and associated segmental colitis. However more recent biopsies are consistent with a chronic active colitis. She has been treated periodically with antibiotics and has been maintained on mesalamine therapy in the form of: Cell over the past few years. She has periodically required courses of steroids for exacerbations. She also has history of C. difficile colitis several years ago.  Patient with chronic GERD, history of Zollinger-Ellison syndrome maintained on chronic PPI therapy, history of asthma and COPD. Last sigmoidoscopy was done in June of 2014 showed a mild left-sided colitis. Biopsies were positive for chronic mildly active colitis. She was treated at that time with steroids and mesalamine and Canasa.  Patient has not been seen over the past 8 months. She comes in today with her daughter stating that she's been having a mild flare of her symptoms over the past one to 2 weeks. She has been taking Colazal 6 tablets daily. Has not been on any steroids over the past several months. She says she's having 4-5 bowel movements per day associated with some abdominal cramping and intermittent rectal bleeding of dark and bright red blood. She's not had any fever or chills, no nausea or vomiting. Appetite has been fine. She has not had any antibiotics for a long time. Most recent labs were done by her PCP in March of 2015 and showed date of DVT of 7.1, hemoglobin 12.6 hematocrit of 39.9 MCV of 69 and platelets of 312.    Review of Systems  Constitutional: Negative.   HENT: Negative.   Respiratory: Negative.   Cardiovascular: Negative.   Gastrointestinal: Positive for abdominal pain, diarrhea and blood in stool.  Endocrine: Negative.     Genitourinary: Negative.   Musculoskeletal: Positive for gait problem.  Allergic/Immunologic: Negative.   Neurological: Negative.   Hematological: Negative.   Psychiatric/Behavioral: Negative.    Outpatient Prescriptions Prior to Visit  Medication Sig Dispense Refill  . albuterol (PROVENTIL HFA;VENTOLIN HFA) 108 (90 BASE) MCG/ACT inhaler Inhale 2 puffs into the lungs 4 (four) times daily.      Marland Kitchen amLODipine (NORVASC) 5 MG tablet Take 5 mg by mouth daily.        Marland Kitchen aspirin 325 MG tablet Take 325 mg by mouth daily.        . AZO-CRANBERRY PO Take 2 capsules by mouth daily. As needed      . balsalazide (COLAZAL) 750 MG capsule TAKE THREE CAPSULES BY MOUTH TWO TIMES A DAY  180 capsule  3  . celecoxib (CELEBREX) 100 MG capsule Take 100 mg by mouth daily. As needed pain      . cyanocobalamin (,VITAMIN B-12,) 1000 MCG/ML injection Inject 1 ml into a muscle once weekly for 3 weeks and then monthly for one year.  1 mL  15  . esomeprazole (NEXIUM) 40 MG capsule Take 40 mg by mouth 2 (two) times daily.       . Fluticasone-Salmeterol (ADVAIR DISKUS) 100-50 MCG/DOSE AEPB Inhale 1 puff into the lungs every 12 (twelve) hours.        . gabapentin (NEURONTIN) 300 MG capsule Take one tablet in the morning and take two tablets at bedtime.      Marland Kitchen HYDROcodone-acetaminophen (NORCO) 10-325 MG per tablet  Take 1 tablet by mouth as needed.        Marland Kitchen levothyroxine (SYNTHROID, LEVOTHROID) 50 MCG tablet Take 50 mcg by mouth daily.       . metoprolol tartrate (LOPRESSOR) 25 MG tablet Take 25 mg by mouth 2 (two) times daily.        . Multiple Vitamin (MULTIVITAMIN) capsule Take 1 capsule by mouth daily.        Marland Kitchen NASONEX 50 MCG/ACT nasal spray Place 2 sprays into the nose daily.       . Omega-3 Fatty Acids (FISH OIL) 1000 MG CAPS Take 1 capsule by mouth 2 (two) times daily.        . Phenazopyridine HCl (AZO TABS PO) Take by mouth. As directed, for bladder      . Polyethyl Glycol-Propyl Glycol (SYSTANE OP) Place 2 drops into  both eyes at bedtime. As directed      . Probiotic Product (PROBIOTIC PO) Take 1 capsule by mouth daily.      . Psyllium 28 % POWD Take one teaspoon once a day       . hyoscyamine (ANASPAZ) 0.125 MG TBDP Place tablet on tongue every4-6 hours as needed .  50 tablet  2  . Budesonide 9 MG TB24 Take 1 tablet by mouth daily.  30 tablet  1  . CANASA 1000 MG suppository PLACE 1 SUPPOSITORY (1,000 MG TOTAL) RECTALLY AT BEDTIME.  30 suppository  0  . clindamycin (CLEOCIN) 300 MG capsule Take 300 mg by mouth 3 (three) times daily. For 7 days      . Ferrous Fum-Iron Polysacch-FA 162-115.2-1 MG CAPS Take 1 capsule by mouth daily.  30 each  6   No facility-administered medications prior to visit.   Allergies  Allergen Reactions  . Codeine Phosphate     REACTION: unspecified  . Ivp Dye [Iodinated Diagnostic Agents] Other (See Comments)    Made her feel like she was smothering  . Levofloxacin Hives  . Moxifloxacin   . Penicillins   . Amoxicillin Rash    REACTION: unspecified  . Doxycycline Rash  . Sulfa Antibiotics Rash   Patient Active Problem List   Diagnosis Date Noted  . Pseudomembranous colitis 08/07/2011  . Colitis, ulcerative 09/04/2010  . HYPERTENSION 11/13/2008  . RBBB 11/13/2008  . PREMATURE VENTRICULAR CONTRACTIONS 11/13/2008  . MURMUR 11/13/2008  . HYPOTHYROIDISM 06/30/2007  . HYPERLIPIDEMIA 06/30/2007  . CAD 06/30/2007  . PERIPHERAL VASCULAR DISEASE 06/30/2007  . ASTHMA 06/30/2007  . GASTROESOPHAGEAL REFLUX DISEASE 06/30/2007  . HIATAL HERNIA 06/30/2007  . DIVERTICULOSIS, COLON 06/30/2007  . UTI'S, RECURRENT 06/30/2007  . ARTHRITIS 06/30/2007  . SPINAL STENOSIS 06/30/2007      History  Substance Use Topics  . Smoking status: Never Smoker   . Smokeless tobacco: Never Used  . Alcohol Use: No   family history includes Colon cancer in her father and sister; Diabetes in her brother and daughter; Heart disease in her brother and mother.  Objective:   Physical Exam   well-developed elderly white female in no acute distress, accompanied by her daughter. Patient is in a wheelchair blood pressure 90/60 pulse 82 height 5 foot 3 ,not weighed today. HEENT; nontraumatic normocephalic EOMI PERRLA sclera anicteric, Neck supple; no JVD, Cardiovascular ;regular rate and rhythm with S1-S2 no murmur or gallop, Pulmonary; clear bilaterally, Abdomen ;soft,no focal tenderness ,no guarding or rebound no palpable mass or hepatosplenomegaly bowel sounds are active, Rectal; exam not done, Extremities; no clubbing cyanosis or edema skin warm and  dry, Psych ;mood and affect appropriate        Assessment & Plan:  #10  80 year old white female with left sided ulcerative colitis/segmental colitis/diverticulosis. Patient currently with an exacerbation of symptoms x2 weeks with increased frequency of bowel movements, diarrhea and rectal bleeding. #2 history of Zollinger-Ellison syndrome on chronic PPI therapy #3 coronary artery disease #4 hypertension #5 hyperlipidemia #6 remote history of C. difficile colitis  Plan; continue Colazal 750 mg, 3 capsules twice daily Check CBC today Start Prednisone 20 mg by mouth every morning x3 weeks then decrease to 15 mg by mouth daily x2 weeks then decrease to 10 mg by mouth daily x2 weeks then 5 mg by mouth daily x2 weeks then stop. Anaspaz  one by mouth every 4-6 hours as needed for cramping Stop Celebrex Continue Nexium 40 mg twice daily  Patient will be established with Dr. Hilarie Fredrickson. She will followup as needed. I've instructed her daughter to call if her symptoms are not improving in the next week or so and if at any point her symptoms flare when they're tapering steroids and we may be able to treat over the phone.  Addendum: Reviewed and agree with initial management. C. difficile PCR if symptoms fail to improve Jerene Bears, MD

## 2013-07-19 ENCOUNTER — Telehealth: Payer: Self-pay | Admitting: Physician Assistant

## 2013-07-19 NOTE — Telephone Encounter (Signed)
Spoke with patient's daughter and gave her recommendations.

## 2013-07-19 NOTE — Telephone Encounter (Signed)
Sure they can use canasa once daily - make sure she is taking the Anaspaz for cramping regularly

## 2013-07-19 NOTE — Telephone Encounter (Signed)
Spoke with patient's daughter. She states the patient's stools are now loose but not liquid 3-4/day. Patient is having problems with cramping in AM. States she has bad cramping and has to go to the bathroom. She has some Canasa suppositories that Dr. Sharlett Iles gave her last year and is asking if she can use one of these. Please, advise.

## 2013-08-23 ENCOUNTER — Telehealth: Payer: Self-pay | Admitting: Physician Assistant

## 2013-08-23 NOTE — Telephone Encounter (Signed)
Ok, please have pt go back up to 20 mg of prednisone per day x 43more weeks the back to 20 mg per day. ROV in 4-6 weeks- me or primary MD

## 2013-08-23 NOTE — Telephone Encounter (Signed)
Spoke with patient's caregiver. She states she did ok for awhile. She is tapering her Prednisone. She is on 10 mg/day and will decrease to 1/2 tablet on 08/30/13. She is having small  loose stools 4-5/day that are rust color. Caregiver thinks she is still having some bleeding. Denies cramping. She is taking Colazal 3 tablets/BID. She is not taking suppositories. Please, advise.

## 2013-08-24 NOTE — Telephone Encounter (Signed)
I have clarified the orders from [facility]  Increase prednisone to 20 mg for 3 weeks then decrease to 10 mg until she is seen in the office on 7/13 with Dr. Hilarie Fredrickson.  Daughter verbalized understanding of all instructions.

## 2013-09-09 ENCOUNTER — Other Ambulatory Visit: Payer: Self-pay | Admitting: Physician Assistant

## 2013-09-21 ENCOUNTER — Encounter: Payer: Self-pay | Admitting: Internal Medicine

## 2013-09-26 ENCOUNTER — Encounter: Payer: Self-pay | Admitting: Internal Medicine

## 2013-09-26 ENCOUNTER — Ambulatory Visit (INDEPENDENT_AMBULATORY_CARE_PROVIDER_SITE_OTHER): Payer: Medicare Other | Admitting: Internal Medicine

## 2013-09-26 ENCOUNTER — Other Ambulatory Visit (INDEPENDENT_AMBULATORY_CARE_PROVIDER_SITE_OTHER): Payer: Medicare Other

## 2013-09-26 VITALS — BP 120/60 | HR 72

## 2013-09-26 DIAGNOSIS — E538 Deficiency of other specified B group vitamins: Secondary | ICD-10-CM

## 2013-09-26 DIAGNOSIS — D509 Iron deficiency anemia, unspecified: Secondary | ICD-10-CM

## 2013-09-26 DIAGNOSIS — K5150 Left sided colitis without complications: Secondary | ICD-10-CM

## 2013-09-26 DIAGNOSIS — A0472 Enterocolitis due to Clostridium difficile, not specified as recurrent: Secondary | ICD-10-CM

## 2013-09-26 LAB — CBC WITH DIFFERENTIAL/PLATELET
BASOS ABS: 0.1 10*3/uL (ref 0.0–0.1)
Basophils Relative: 0.6 % (ref 0.0–3.0)
EOS ABS: 0 10*3/uL (ref 0.0–0.7)
Eosinophils Relative: 0.1 % (ref 0.0–5.0)
HCT: 36.4 % (ref 36.0–46.0)
Hemoglobin: 11.1 g/dL — ABNORMAL LOW (ref 12.0–15.0)
LYMPHS PCT: 27.5 % (ref 12.0–46.0)
Lymphs Abs: 3.2 10*3/uL (ref 0.7–4.0)
MCHC: 30.6 g/dL (ref 30.0–36.0)
MCV: 69 fl — ABNORMAL LOW (ref 78.0–100.0)
MONOS PCT: 8.8 % (ref 3.0–12.0)
Monocytes Absolute: 1 10*3/uL (ref 0.1–1.0)
NEUTROS PCT: 63 % (ref 43.0–77.0)
Neutro Abs: 7.3 10*3/uL (ref 1.4–7.7)
PLATELETS: 348 10*3/uL (ref 150.0–400.0)
RBC: 5.28 Mil/uL — ABNORMAL HIGH (ref 3.87–5.11)
RDW: 20.3 % — AB (ref 11.5–15.5)
WBC: 11.6 10*3/uL — ABNORMAL HIGH (ref 4.0–10.5)

## 2013-09-26 MED ORDER — PREDNISONE 10 MG PO TABS: ORAL_TABLET | ORAL | Status: DC

## 2013-09-26 MED ORDER — CYANOCOBALAMIN 1000 MCG/ML IJ SOLN: INTRAMUSCULAR | Status: DC

## 2013-09-26 MED ORDER — BALSALAZIDE DISODIUM 750 MG PO CAPS: ORAL_CAPSULE | ORAL | Status: DC

## 2013-09-26 NOTE — Patient Instructions (Addendum)
Continue Prednisone 10mg  through the month of July On Aug 1 start 5mg  Prednisone  We have sent in your prescriptions to your pharmacy( B12,Colozal and Prednisone) Follow up in 8-12 weeks

## 2013-09-26 NOTE — Progress Notes (Signed)
Subjective:    Patient ID: [name], female    DOB: May 12, 1915, 80 y.o.   MRN: [id number]  HPI [name] is a 80 yo female with PMH of left-sided ulcerative colitis versus SCAD, diverticulosis, Zollinger-Ellison syndrome, GERD, asthma and COPD who is seen in followup. She was seen by Nicoletta Ba, PA-C ON 07/12/2013 with mild flare of her left-sided colitis. She was started on a prednisone taper beginning at 20 mg tapering down slowly over weeks. Today she returns with her daughter for followup. She responded initially well to the oral prednisone in conjunction with balsalazide 3 tabs twice daily. Her diarrhea and left lower quadrant abdominal pain responded over several weeks. She tapered down slowly as directed and when her taper reach 10 mg daily she noticed looser stools and return of blood and mucus in her stool. She called in and was advised to increase prednisone back to 20 mg daily which she took from mid June to July 1. On July 1 she decrease prednisone back to 10 mg daily. Currently she is feeling better. Most days her stools are soft and formed occurring one to 2 times daily. Occasionally she does see mucus but has not seen blood in almost 2 weeks. When she was flaring she was having 4-5 mostly liquid stools daily. She has some left lower quadrant discomfort but overall this is significantly improved. She reports a good appetite. She is taking Florastor once daily and was recently treated with a Z-Pak for sinusitis. She denies nausea or vomiting. No fevers. He's had some weight loss approximately 8 pounds over the last 6-12 months. She is taking Nexium 40 mg once to twice daily, though she admits to forgetting the second dose on some days.   Review of Systems As per history of present illness, otherwise negative  Current Medications, Allergies, Past Medical History, Past Surgical History, Family History and Social History were reviewed in [employer]  record.     Objective:   Physical Exam BP 120/60  Pulse 72 Constitutional: Well-developed and well-nourished. No distress, elderly female in wheelchair. HEENT: Normocephalic and atraumatic. Oropharynx is clear and moist. No oropharyngeal exudate. Conjunctivae are normal.  No scleral icterus. Neck: Neck supple. Trachea midline. Cardiovascular: Normal rate, regular rhythm and intact distal pulses.  Pulmonary/chest: Effort normal and mildly rhonchorous tracheal breath sounds without rales or wheezes Abdominal: Soft, nontender, nondistended. Bowel sounds active throughout.  Extremities: no clubbing, cyanosis, or edema Neurological: Alert and oriented to person place and time. Skin: Skin is warm and dry. No rashes noted. Psychiatric: Normal mood and affect. Behavior is normal.  CBC    Component Value Date/Time   WBC 9.5 07/12/2013 1432   RBC 5.56* 07/12/2013 1432   HGB 12.1 07/12/2013 1432   HCT 38.3 07/12/2013 1432   PLT 337.0 07/12/2013 1432   MCV 69.0 Repeated and verified X2.* 07/12/2013 1432   MCH 23.2* 08/06/2011 0520   MCHC 31.7 07/12/2013 1432   RDW 17.3* 07/12/2013 1432   LYMPHSABS 2.2 07/12/2013 1432   MONOABS 0.8 07/12/2013 1432   EOSABS 0.0 07/12/2013 1432   BASOSABS 0.0 07/12/2013 1432    CMP     Component Value Date/Time   NA 135 09/07/2012 1542   K 4.4 09/07/2012 1542   CL 99 09/07/2012 1542   CO2 27 09/07/2012 1542   GLUCOSE 152* 09/07/2012 1542   BUN 21 09/07/2012 1542   CREATININE 0.8 09/07/2012 1542   CALCIUM 9.2 09/07/2012 1542   PROT  6.9 09/07/2012 1542   ALBUMIN 3.7 09/07/2012 1542   AST 13 09/07/2012 1542   ALT 11 09/07/2012 1542   ALKPHOS 51 09/07/2012 1542   BILITOT 0.5 09/07/2012 1542   GFRNONAA 75* 08/05/2011 1604   GFRAA 87* 08/05/2011 1604    Iron/TIBC/Ferritin/ %Sat    Component Value Date/Time   IRON 26* 09/07/2012 1542   FERRITIN 9.3* 09/07/2012 1542   IRONPCTSAT 7.4* 09/07/2012 1542    Flex sig Sharlett Iles, June 2014 -- reviewed     Assessment & Plan:   80 yo female with PMH of left-sided ulcerative colitis versus SCAD, diverticulosis, Zollinger-Ellison syndrome, GERD, asthma and COPD who is seen in followup.   1. Left-sided colitis, recent flare -- her symptoms and biopsies are more consistent with left-sided UC than SCAD.  She has responded to prednisone therapy and I have recommended that she continue 10 mg daily for the rest of this month and taper to 5 mg daily on 10/15/2013. She will continue balsalazide 3 tablets twice daily. Instructed them to call me if symptoms worsen with tapering of prednisone or before so. If diarrhea returns we'll check C. difficile PCR. Prolonged prednisone is less than ideal, though I am hesitant to escalate therapy to immunomodulator or biologic in this 80 year old female. This was discussed with the patient and family who understand and agree --Labs today to include CBC, CMP, B12, vitamin D and iron studies --Flu vaccine recommended this fall, has had Pneumovax   2. history of iron deficiency anemia -- labs today. Previously on oral iron. May need to resume this  3. B12 deficiency -- IM B12 given monthly by daughter, refill today, check B12 today  4. History of ZE -- continue twice a day PPI

## 2013-09-27 LAB — COMPREHENSIVE METABOLIC PANEL WITH GFR
ALT: 13 U/L (ref 0–35)
AST: 15 U/L (ref 0–37)
Albumin: 3.3 g/dL — ABNORMAL LOW (ref 3.5–5.2)
Alkaline Phosphatase: 39 U/L (ref 39–117)
BUN: 21 mg/dL (ref 6–23)
CO2: 31 meq/L (ref 19–32)
Calcium: 8.8 mg/dL (ref 8.4–10.5)
Chloride: 105 meq/L (ref 96–112)
Creatinine, Ser: 0.9 mg/dL (ref 0.4–1.2)
GFR: 65.89 mL/min
Glucose, Bld: 96 mg/dL (ref 70–99)
Potassium: 4.2 meq/L (ref 3.5–5.1)
Sodium: 140 meq/L (ref 135–145)
Total Bilirubin: 0.4 mg/dL (ref 0.2–1.2)
Total Protein: 6 g/dL (ref 6.0–8.3)

## 2013-09-27 LAB — IBC PANEL
Iron: 14 ug/dL — ABNORMAL LOW (ref 42–145)
SATURATION RATIOS: 3.6 % — AB (ref 20.0–50.0)
Transferrin: 274.9 mg/dL (ref 212.0–360.0)

## 2013-09-27 LAB — VITAMIN B12: Vitamin B-12: 532 pg/mL (ref 211–911)

## 2013-09-27 LAB — FERRITIN: Ferritin: 6.9 ng/mL — ABNORMAL LOW (ref 10.0–291.0)

## 2013-09-27 LAB — VITAMIN D 25 HYDROXY (VIT D DEFICIENCY, FRACTURES): VITD: 55.84 ng/mL

## 2013-09-27 LAB — IRON: IRON: 14 ug/dL — AB (ref 42–145)

## 2013-09-28 ENCOUNTER — Other Ambulatory Visit: Payer: Self-pay

## 2013-09-28 DIAGNOSIS — D509 Iron deficiency anemia, unspecified: Secondary | ICD-10-CM

## 2013-09-28 MED ORDER — INTEGRA PLUS PO CAPS: 1.0000 | ORAL_CAPSULE | Freq: Every day | ORAL | Status: DC

## 2013-12-05 ENCOUNTER — Telehealth: Payer: Self-pay | Admitting: Internal Medicine

## 2013-12-05 NOTE — Telephone Encounter (Signed)
Pt has been on prednisone 5mg  daily and has 8 doses left. She called to get follow-up appt but next available is the end of November. Pt is still taking balsalazide 3 pills twice daily and states she is doing quite well. Caregiver wants to make sure Dr. Hilarie Fredrickson is ok with her stopping the prednisone. Please advise.

## 2013-12-05 NOTE — Telephone Encounter (Signed)
I am okay discontinue prednisone This has been a slow taper of prednisone for colitis Hopefully balsalazide will be enough to maintain remission now that she has improved Once off prednisone if any recurrent colitis symptoms she should notify us immediately

## 2013-12-06 NOTE — Telephone Encounter (Signed)
Spoke with pts daughter and she is aware. 

## 2014-01-10 ENCOUNTER — Telehealth: Payer: Self-pay | Admitting: Internal Medicine

## 2014-01-10 NOTE — Telephone Encounter (Signed)
Spoke with [name] and let her know it would be fine for her to wait until her MD appt for labs.

## 2014-01-16 ENCOUNTER — Other Ambulatory Visit: Payer: Self-pay | Admitting: Internal Medicine

## 2014-02-01 ENCOUNTER — Telehealth: Payer: Self-pay | Admitting: Internal Medicine

## 2014-02-01 NOTE — Telephone Encounter (Signed)
Pt has been off of prednisone since September, she is currently taking balsalazide. Pts daughter states that for the past 3-4 days she has been having pain in her RLQ. Pt c/o abdominal cramping last night after supper and then had a bloody stool. States she had some BRB on the tissue this morning. Daughter thinks she might be having a flare. Please advise.

## 2014-02-02 NOTE — Telephone Encounter (Signed)
Spoke with pts daughter and they also called the pts PCP. Pt was placed on prednisone by her PCP and took Prednisone 40mg  today and is then to take 20mg  for 7 days and then taper down. State they will take this and discuss with Dr. Hilarie Fredrickson at their visit next week and make changes if needed. Pt is going to try to have labs done at PCP office also.

## 2014-02-02 NOTE — Telephone Encounter (Signed)
Cbc, bmet GI pathogen panel Uceris 9 mg daily to treat possible UC flare while waiting on labs and stool studies

## 2014-02-02 NOTE — Telephone Encounter (Signed)
Daughter calling back regarding message. Please advise.

## 2014-02-02 NOTE — Telephone Encounter (Signed)
Pt's daughter is calling back from the previous message

## 2014-02-03 ENCOUNTER — Telehealth: Payer: Self-pay | Admitting: Internal Medicine

## 2014-02-03 NOTE — Telephone Encounter (Signed)
Orders faxed

## 2014-02-08 ENCOUNTER — Encounter: Payer: Self-pay | Admitting: Internal Medicine

## 2014-02-08 ENCOUNTER — Ambulatory Visit (INDEPENDENT_AMBULATORY_CARE_PROVIDER_SITE_OTHER): Payer: Medicare Other | Admitting: Internal Medicine

## 2014-02-08 VITALS — BP 100/58 | HR 60 | Ht 63.0 in

## 2014-02-08 DIAGNOSIS — K51911 Ulcerative colitis, unspecified with rectal bleeding: Secondary | ICD-10-CM

## 2014-02-08 DIAGNOSIS — D509 Iron deficiency anemia, unspecified: Secondary | ICD-10-CM

## 2014-02-08 MED ORDER — BALSALAZIDE DISODIUM 750 MG PO CAPS: 2250.0000 mg | ORAL_CAPSULE | Freq: Three times a day (TID) | ORAL | Status: DC

## 2014-02-08 MED ORDER — PREDNISONE 10 MG PO TABS: ORAL_TABLET | ORAL | Status: DC

## 2014-02-08 NOTE — Progress Notes (Signed)
Subjective:    Patient ID: [name], female    DOB: 1915/04/06, 80 y.o.   MRN: [id number]  HPI [name] is a 80 yo female with PMH of also of colitis, diverticulosis, ZE syndrome, GERD, asthma and COPD who is here for follow-up. She was last seen in the office in July 2015. At that time she had a flare of her colitis and was tapered off prednisone. Over the last several weeks she has had recurrent abdominal symptoms consistent with a flare of her colitis. She developed mid and right-sided abdominal discomfort along with loose stools which were bloody on occasion. At this time her appetite was decreased but she was not having vomiting. She was having mild nausea. No fevers or chills. She had contacted our office and I recommended labs and stool studies along with colonic release budesonide. Due to the distance from our office she preferred to see primary care and she did so. Labs and stool studies were performed and she was started on prednisone 40 mg 1 day, and then 20 mg 1 week. She is currently on day 6 of the 20 mg prednisone. She has had resolution of her abdominal pain. Her stools have become formed and nonbloody. No further nausea. Appetite has been good. She did use a couple of doses of hydrocodone when her pain was present but she has not needed pain medication. She continues on balsalazide 2.25 g twice daily.  Review of Systems As per history of present illness, otherwise negative  Current Medications, Allergies, Past Medical History, Past Surgical History, Family History and Social History were reviewed in [employer] record.     Objective:   Physical Exam BP 100/58 mmHg  Pulse 60  Ht 5\' 3"  (1.6 m)  Wt  Constitutional: Well-developed elderly appearing female, well-nourished. No distress. HEENT: Normocephalic and atraumatic. Oropharynx is clear and moist. No oropharyngeal exudate. Conjunctivae are normal.  No scleral icterus. Neck: Neck supple.  Trachea midline. Cardiovascular: Normal rate, regular rhythm and intact distal pulses.  Pulmonary/chest: Effort normal and breath sounds normal. No wheezing, rales or rhonchi. Abdominal: Soft, nontender, nondistended. Bowel sounds active throughout Extremities: no clubbing, cyanosis, or edema Neurological: Alert and oriented to person place and time. Skin: Skin is warm and dry. No rashes noted. Psychiatric: Normal mood and affect. Behavior is normal.  C. difficile test negative, 02/04/2014 -- communicated by phone from primary care office Basic metabolic panel [id number]/51/8841 within normal limits except for mild hyponatremia at 133 and a mildly low chloride at 97 WBC 11.3, hemoglobin 11.2, hematocrit 33.8, MCV 64.8, platelet 371    Assessment & Plan:   80 yo female with PMH of also of colitis, diverticulosis, ZE syndrome, GERD, asthma and COPD who is here for follow-up.  1. Ulcerative colitis with flare -- her symptoms have responded to prednisone and her C. difficile test was negative. I am increasing her balsalazide to 2.25 g 3 times daily. I will slightly slow her prednisone taper and have her do 15 mg 1 week, 10 mg 1 week and 5 mg 1 week. We discussed escalation of therapy but due to her age and other medical comorbidities she and her family remain reluctant to do so. Time will tell whether or not she can maintain clinical remission off steroids. Hopefully the higher dose of balsalazide will help. If symptoms return she may need another prednisone taper, but need to remain on low-dose prednisone such as 5 mg daily. This is less ideal for  the treatment of colitis, but given her age is felt preferable to the addition of thiopurine or biologic therapy. --Office follow-up suggested but her daughter due to the distance and difficulty getting here, would prefer to contact my office to let us know how she is doing.  2. IDA -- very mild anemia but microcytic. I recommend she continue oral iron daily.  NSAIDs

## 2014-02-08 NOTE — Patient Instructions (Addendum)
Continue your colazal but increase to 3 capsules three times daily. Taper the prednisone as follows, 1 more day of 20 mg and then 15 mg for 1 week, 10 mg for 1 week, then 5 mg for 1 week.  We have sent you refills. Call if symptoms return. CC:  Alean Rinne MD

## 2014-02-21 ENCOUNTER — Encounter: Payer: Self-pay | Admitting: Internal Medicine

## 2014-02-22 ENCOUNTER — Encounter: Payer: Self-pay | Admitting: Internal Medicine

## 2014-02-28 ENCOUNTER — Encounter: Payer: Self-pay | Admitting: Internal Medicine

## 2014-05-31 ENCOUNTER — Telehealth: Payer: Self-pay | Admitting: Internal Medicine

## 2014-05-31 NOTE — Telephone Encounter (Signed)
Pts daughter states pt had some bright red blood from her rectum last night, states it was about 1-2 tsp. States pt did not have any more bleeding during the night. This morning pt had some BRB from her rectum but not as much as last night. Pt has had no cramping or diarrhea.   Also state Insurance will not pay for name brand nexium anymore, would like to know if Dr. Hilarie Fredrickson thinks pt would be ok to take generic. Please advise.

## 2014-06-01 NOTE — Telephone Encounter (Signed)
Pts daughter aware. States she will get the pts PCP to send in the script.

## 2014-06-01 NOTE — Telephone Encounter (Signed)
Patient with history of IBD Please let us know if bleeding continues, and if so she needs to be seen Okay for generic Nexium which can be prescribed

## 2014-06-05 ENCOUNTER — Other Ambulatory Visit: Payer: Self-pay | Admitting: Internal Medicine

## 2014-09-11 ENCOUNTER — Telehealth: Payer: Self-pay | Admitting: Internal Medicine

## 2014-09-11 NOTE — Telephone Encounter (Signed)
If iron remains low, would recommend ferrous sulfate 325 mg 1-2 tabs daily This may decrease stool frequency as oral iron can cause constipation and some patients This would be my first recommendation, if unwilling for oral iron could consider 2 mg loperamide each morning Call if not working or symptoms worsening

## 2014-09-11 NOTE — Telephone Encounter (Signed)
Patient is having 4-5 loose stools a day.  No bleeding, no cramping, no other other problems just more frequent stools.  She is on colazal 3 TID, no prednisone.  She has follow up with you in August.  She and her daughter are asking if there are any medications changes to decrease the number of stools a day.  She is no longer on oral iron, but remains iron deficient.  She will see hematology next week in [place].  Please advise on loose stool.

## 2014-09-12 NOTE — Telephone Encounter (Signed)
Patient's daughter notified of recommendations.

## 2014-10-09 ENCOUNTER — Other Ambulatory Visit: Payer: Self-pay | Admitting: Internal Medicine

## 2014-10-17 ENCOUNTER — Other Ambulatory Visit: Payer: Self-pay | Admitting: Internal Medicine

## 2014-10-19 ENCOUNTER — Encounter: Payer: Self-pay | Admitting: *Deleted

## 2014-10-30 ENCOUNTER — Encounter: Payer: Self-pay | Admitting: Internal Medicine

## 2014-10-30 ENCOUNTER — Ambulatory Visit (INDEPENDENT_AMBULATORY_CARE_PROVIDER_SITE_OTHER): Payer: Medicare Other | Admitting: Internal Medicine

## 2014-10-30 ENCOUNTER — Telehealth: Payer: Self-pay | Admitting: *Deleted

## 2014-10-30 VITALS — BP 102/60 | HR 66 | Ht 63.0 in | Wt 120.0 lb

## 2014-10-30 DIAGNOSIS — K5190 Ulcerative colitis, unspecified, without complications: Secondary | ICD-10-CM | POA: Diagnosis not present

## 2014-10-30 DIAGNOSIS — E538 Deficiency of other specified B group vitamins: Secondary | ICD-10-CM | POA: Diagnosis not present

## 2014-10-30 DIAGNOSIS — D509 Iron deficiency anemia, unspecified: Secondary | ICD-10-CM | POA: Diagnosis not present

## 2014-10-30 MED ORDER — CYANOCOBALAMIN 1000 MCG/ML IJ SOLN: 1000.0000 ug | INTRAMUSCULAR | Status: AC

## 2014-10-30 MED ORDER — HYOSCYAMINE SULFATE 0.125 MG PO TBDP: ORAL_TABLET | ORAL | Status: DC

## 2014-10-30 MED ORDER — DICYCLOMINE HCL 10 MG PO CAPS: ORAL_CAPSULE | ORAL | Status: AC

## 2014-10-30 NOTE — Patient Instructions (Signed)
We have sent the following medications to your pharmacy for you to pick up at your convenience: B12 Levsin  Please continue Colozal 3 tablets 3 times daily.  Continue Imodium as needed.  Please follow up in 1 year or sooner if needed.

## 2014-10-30 NOTE — Telephone Encounter (Signed)
Bentyl 10 mg every 4-6 hr prn Make pt aware of why we are changing

## 2014-10-30 NOTE — Progress Notes (Signed)
   Subjective:    Patient ID: [name], female    DOB: 1915-05-26, 81 y.o.   MRN: [id number]  HPI [name] is a 81 yo female with PMH of ulcerative colitis, diverticulosis, ZE syndrome, GERD, asthma and COPD who is here for follow-up. She is with her daughter today. She was last seen in November 2015. Today she reports she is doing well. She continues on balsalazide 750 mg 3 capsules 3 times daily. She has remained off prednisone entirely. She reports occasional lower abdominal cramping pain which response to Levsin. No ongoing abdominal pain or rectal bleeding. She has 1-2 stools on most days but can have up to 3 stools daily. If there are loose she's been using 2-4 mg of Imodium. She estimates using Imodium less than 2 days per week. Appetite has been good. She denies nausea or vomiting. Continues on Nexium 40 mg only once daily. She often forgets to take the second dose. She was seen by hematology, Dr. Bobby Rumpf and had 2 doses of IV Feraheme in July. She was not tolerating oral iron and has follow-up with hematology in September. She brings copies of lab results from 09/22/2014. Hemoglobin was 10.0, MCV 62, platelet count 320, white cell 8.2. Ferritin pending, percent sat 7.7, iron 27, TIBC 348   Review of Systems  as per history of present illness, otherwise negative  Current Medications, Allergies, Past Medical History, Past Surgical History, Family History and Social History were reviewed in [employer] record.     Objective:   Physical Exam BP 102/60 mmHg  Pulse 66  Ht 5\' 3"  (1.6 m)  Wt 120 lb (54.432 kg)  BMI 21.26 kg/m2 Constitutional: Well-developed and well-nourished. No distress. HEENT: Normocephalic and atraumatic.  Conjunctivae are normal.  No scleral icterus. Neck: Neck supple. Trachea midline. Cardiovascular: Normal rate, regular rhythm and intact distal pulses. No M/R/G Pulmonary/chest: Effort normal and breath sounds normal. No wheezing, rales  or rhonchi. Abdominal: Soft, nontender, nondistended. Bowel sounds active throughout.Marland Kitchen Extremities: no clubbing, cyanosis, or edema Neurological: Alert and oriented to person place and time. Skin: Skin is warm and dry. No rashes noted. Psychiatric: Normal mood and affect. Behavior is normal.  Labs -- see HPI     Assessment & Plan:  81 yo female with PMH of ulcerative colitis, diverticulosis, ZE syndrome, GERD, asthma and COPD who is here for follow-up.   1. Ulcerative colitis, chronic -- currently in clinical remission. Having some loose nonbloody stools without abdominal pain. Her prior flares included bloody loose stools and abdominal pain. We will continue balsalazide 2.25 g 3 times a day. Fortunately she is now off prednisone. She can use Levsin 0.125 every 4-6 hours as needed for lower abdominal cramping, refills provided. When necessary Imodium 2-4 mg daily  2. IDA -- receiving IV iron through Dr. Bobby Rumpf in [place]. She will follow-up with him in September. He will follow CBC and iron studies  3. B12 deficiency -- continue IM B12 1000 g every 4 weeks  12 month follow-up, sooner if necessary

## 2014-10-30 NOTE — Telephone Encounter (Signed)
Rx sent. Patient advised of change.

## 2014-10-30 NOTE — Telephone Encounter (Signed)
Patient's insurance will not cover hyoscyamine any longer. Please advise.Marland KitchenMarland Kitchen

## 2014-12-10 ENCOUNTER — Other Ambulatory Visit: Payer: Self-pay | Admitting: Internal Medicine

## 2014-12-11 ENCOUNTER — Other Ambulatory Visit: Payer: Self-pay | Admitting: Internal Medicine

## 2014-12-12 ENCOUNTER — Other Ambulatory Visit: Payer: Self-pay | Admitting: *Deleted

## 2014-12-12 MED ORDER — BALSALAZIDE DISODIUM 750 MG PO CAPS: ORAL_CAPSULE | ORAL | Status: AC

## 2015-01-11 ENCOUNTER — Telehealth: Payer: Self-pay | Admitting: Internal Medicine

## 2015-01-11 NOTE — Telephone Encounter (Signed)
Pts daughter states that the pt fell about 3 weeks ago and hurt her foot and has been on bed rest. She is having mushy stools a couple a day. There is some pink congealed substance in the stool the daughter thinks is blood. Pt is c/o some right sided cramping and a little nausea. Pt is taking imodium sometimes twice a day. Please advise.

## 2015-01-12 MED ORDER — PREDNISONE 5 MG PO TABS: ORAL_TABLET | ORAL | Status: AC

## 2015-01-12 NOTE — Telephone Encounter (Signed)
Lower than usual dose pred taper 20 mg x 7 days, 15 mg x 7 days, 10 mg x 7 days, 5 mg x 7 days Call if worsening

## 2015-01-12 NOTE — Telephone Encounter (Signed)
Spoke with pts daughter and script sent to pharmacy.

## 2015-02-05 ENCOUNTER — Telehealth: Payer: Self-pay | Admitting: Internal Medicine

## 2015-02-05 ENCOUNTER — Other Ambulatory Visit: Payer: Self-pay

## 2015-02-05 MED ORDER — PREDNISONE 10 MG PO TABS: ORAL_TABLET | ORAL | Status: AC

## 2015-02-05 NOTE — Telephone Encounter (Signed)
Spoke with pts daughter and new script for prednisone sent to pharmacy.

## 2015-02-05 NOTE — Telephone Encounter (Signed)
Pts daughter states that her mother has been on a prednisone dose taper for U/C flare. Friday she took 5mg  and Saturday she had diarrhea with a little blood in it. Saturday and Sunday and this morning prednisone was increased to 10mg  by the daughter. Asking what they need to do now regarding the prednisone. Please advise.

## 2015-02-05 NOTE — Telephone Encounter (Signed)
If she was doing well on the prednisone at 10 mg daily, would plan to continue this for an additional month and then try to taper to 5 mg daily after that. Let me know if not better on the 10 mg dose. Continue balsalazide 2.25 g TID

## 2015-03-06 ENCOUNTER — Telehealth: Payer: Self-pay | Admitting: Internal Medicine

## 2015-03-06 NOTE — Telephone Encounter (Signed)
Pts daughter states they are supposed to decrease pts prednisone to 5mg  daily. States pt has an infected tooth that will be extracted Thursday but pt has been on cipro for the past 10days, she takes her last dose of this today. Pt has been having diarrhea off and on with some blood in it at times. Daughter wants to know what they need to do in regards to decreasing the prednisone. Please advise.

## 2015-03-06 NOTE — Telephone Encounter (Signed)
Hold prednisone at 10 mg for an additional 4 weeks. Call at the end of 4 weeks to let me know how she is doing and if better will taper to 5 mg daily

## 2015-03-06 NOTE — Telephone Encounter (Signed)
Spoke with pts daughter and she is aware. 

## 2015-03-27 ENCOUNTER — Telehealth: Payer: Self-pay | Admitting: Gastroenterology

## 2015-03-27 NOTE — Telephone Encounter (Signed)
Dr. Elie Confer at [facility] called regarding this patient who is being admitted for advice on mgmt. She has had worsening bloody diarrhea for the past several days and presented to [facility] today. Her hemoglobin is 6.8. Per Dr. Elie Confer she appears to be having a flare of ulcerative colitis. She recently completed a predisone taper and remains on balsalazide. I suggested obtaining stool cultures including C diff, IV volume resuscitation, bowel rest, clear liquids only as tolerated, transfuse to keep hemoglobin greater than 8 and Solu-Medrol 20 mg IV every 12 hours as initial mgmt.

## 2015-03-28 ENCOUNTER — Telehealth: Payer: Self-pay | Admitting: Internal Medicine

## 2015-03-28 NOTE — Telephone Encounter (Signed)
Contacted by Dr. Lennox Grumbles who is caring for [name] while hospitalized Hospitalized with flare of colitis responding to IV Solu-Medrol at dose previously recommended by Dr. Fuller Plan She also has upper rest or infection which they are treating C. difficile is pending I made recommendations to continue IV Solu-Medrol until clear improvement in colitis symptoms, specifically bloody diarrhea and abdominal pain once improved can transition to prednisone 40 mg daily 1-2 weeks with plans to taper by 5 mg every 7 days until off. Continue balsalazide 2.25 g 3 times a day Follow-up with me after hospitalization I also left instructions to inform the patient and her daughter to notify me if colitis symptoms return or begin to worsen again as she tapers prednisone

## 2015-03-28 NOTE — Telephone Encounter (Signed)
See additonal phone note.

## 2015-03-28 NOTE — Telephone Encounter (Signed)
Noted. Pt scheduled to see Dr. Hilarie Fredrickson 05/18/15@2 :30pm. Appt letter mailed.

## 2015-03-28 NOTE — Telephone Encounter (Signed)
Pt admitted to outside hospital Management recommendations given by Dr. Fuller Plan.  If they have additional inpatient management questions, then inpatient GI at that hospital should be consulted Hospital follow-up with me or APP

## 2015-03-29 ENCOUNTER — Telehealth: Payer: Self-pay | Admitting: Internal Medicine

## 2015-03-29 NOTE — Telephone Encounter (Signed)
Attempted to return phone call but cannot get through. Message states that phone number is not in service. Will try again.

## 2015-03-30 NOTE — Telephone Encounter (Signed)
Spoke to Dr. Lennox Grumbles at [facility] today for update (she paged me) Pt being treated for c diff and diarrhea has not yet improved.  No longer bloody.  No abd pain.  Anemia improved and Hgb now stable after transfusions I recommended changing flagyl to PO vancomycin 125 mg QID x 14 days. Florastor 250 mg BID Transition to oral prednisone at 40 mg as previously discussed and as tolerated Dr. Lennox Grumbles states the patient's daughter is reassured now and no longer desires transfer. I tried contacting the patient's daughter, Joaquim Lai and reached her voicemail.  I left a message and she was advised to call back if she has persistent concerns or questions.  Vaughan Basta, please check in with patient's daughter on Monday for an update. Thanks

## 2015-03-30 NOTE — Telephone Encounter (Signed)
Pts daughter states she is concerned about her mother. She is at [facility] and has received blood and IV solumedrol. She has tested positive for C diff. She is worried about her nutritional status and feels that her iron level is very low. States her mother has been in the bed and not up, states she has had to have PT in the past to get her up and moving again. Daughter wants to know what Dr. Hilarie Fredrickson thinks. She would like to have her mother transferred to [facility] to be treated by Dr. Hilarie Fredrickson. Please advise.  Home phone not working-Daughters cell # [phone]

## 2015-04-02 NOTE — Telephone Encounter (Signed)
Pts daughter states her mother had a loose stool today and there was just a little bit of blood in it. Reports her Oxygen level keeps dropping when she does any type of activity. States there was some mention of ulcerations in her esophagus that was seen on CT scan and questioning aspiration. Also state Dr. Had D Dimer drawn and that was positive but mother is allergic to dye so could not see well on CT scan but could have a clot. States they may keep her another week for rehab. Dr. Hilarie Fredrickson given update.

## 2015-04-03 ENCOUNTER — Telehealth: Payer: Self-pay | Admitting: Internal Medicine

## 2015-04-03 NOTE — Telephone Encounter (Signed)
Unable to reach pt, message states need to enter access code.

## 2015-04-03 NOTE — Telephone Encounter (Signed)
Dr. Elie Confer called and states pt is going to be discharged tomorrow. States she wanted to let Dr. Hilarie Fredrickson know what the plans are and see if he had any changes to suggest. States pt to go home on Vancomycin for 2 weeks and a steroid taper decreasing by 5mg  every 7 days for a total of 2 weeks. Let Dr. Elie Confer know if Dr. Hilarie Fredrickson had any changes we would call her back. Please advise.

## 2015-04-03 NOTE — Telephone Encounter (Signed)
Tapering from 40 mg daily and decreasing by 5 mg every 7 days should make the taper last 8 weeks (not 2) Pt/daughter should be advised that if symptoms return during the taper she needs to notify us as soon as possible Pt needs hospital admission followup appt with me/us

## 2015-04-03 NOTE — Telephone Encounter (Signed)
Spoke with pts daughter and she is aware. 

## 2015-04-09 ENCOUNTER — Telehealth: Payer: Self-pay | Admitting: Internal Medicine

## 2015-04-09 NOTE — Telephone Encounter (Signed)
Spoke with pts daughter and she is aware. Spoke with [facility] and records have not been received. Daughter aware.

## 2015-04-09 NOTE — Telephone Encounter (Signed)
Pts daughter states pt had 3 diarrhea stools this morning. Pt wanted Imodium and she gave it to her but wanted to check with Dr. Hilarie Fredrickson and see if this is ok. States she will be on vancomycin until 04/12/15. Please advise. Daughter also wants to know if records have been sent from [facility].

## 2015-04-09 NOTE — Telephone Encounter (Signed)
[facility] for loperamide sparingly Continue vanco for recent c diff in setting of colitis flare I have not seen records from hospitalization yet, can you check to see if they are received?

## 2015-04-11 ENCOUNTER — Telehealth: Payer: Self-pay | Admitting: Internal Medicine

## 2015-04-11 NOTE — Telephone Encounter (Signed)
Pts daughter called and left message that her mother was SOB today and they have had to take her back to [facility] [address] Dr. Hilarie Fredrickson notified.

## 2015-04-26 ENCOUNTER — Encounter: Payer: Self-pay | Admitting: Gastroenterology

## 2015-04-26 ENCOUNTER — Telehealth: Payer: Self-pay | Admitting: Internal Medicine

## 2015-04-26 NOTE — Telephone Encounter (Signed)
Unable to reach pt's daughter  

## 2015-04-27 NOTE — Telephone Encounter (Signed)
Daughter called to let us know pt went home and was being treated for cdiff but started being SOB. Went back to ER and were told she had pneumonia and they could not give her antibiotics due to cdiff. Family chose to take her home with hospice. States she passed away about 9 or 10 days later. She wanted to express her thanks for the care and compassion. Dr. Hilarie Fredrickson aware.

## 2015-05-16 DEATH — deceased

## 2015-05-18 ENCOUNTER — Ambulatory Visit: Payer: Medicare Other | Admitting: Internal Medicine
